# Patient Record
Sex: Female | Born: 1950
Health system: Southern US, Community
[De-identification: ages and names within clinical notes are randomized; demographics above are authoritative.]

## PROBLEM LIST (undated history)

## (undated) DIAGNOSIS — I10 Essential (primary) hypertension: Secondary | ICD-10-CM

## (undated) DIAGNOSIS — R112 Nausea with vomiting, unspecified: Secondary | ICD-10-CM

## (undated) DIAGNOSIS — F0390 Unspecified dementia without behavioral disturbance: Secondary | ICD-10-CM

## (undated) DIAGNOSIS — Z96652 Presence of left artificial knee joint: Secondary | ICD-10-CM

## (undated) DIAGNOSIS — E785 Hyperlipidemia, unspecified: Secondary | ICD-10-CM

## (undated) DIAGNOSIS — M1990 Unspecified osteoarthritis, unspecified site: Secondary | ICD-10-CM

## (undated) DIAGNOSIS — Z9889 Other specified postprocedural states: Secondary | ICD-10-CM

## (undated) HISTORY — PX: TONSILLECTOMY: SUR1361

## (undated) HISTORY — PX: COLONOSCOPY WITH PROPOFOL: SHX5780

## (undated) HISTORY — DX: Presence of left artificial knee joint: Z96.652

## (undated) HISTORY — PX: THYROID SURGERY: SHX805

## (undated) HISTORY — PX: AORTIC VALVE REPLACEMENT: SHX41

## (undated) HISTORY — PX: EXPLORATION POST OPERATIVE OPEN HEART: SHX5061

## (undated) HISTORY — PX: FINGER SURGERY: SHX640

---

## 2017-08-16 DIAGNOSIS — Z0000 Encounter for general adult medical examination without abnormal findings: Secondary | ICD-10-CM | POA: Diagnosis not present

## 2017-08-28 DIAGNOSIS — M25562 Pain in left knee: Secondary | ICD-10-CM | POA: Insufficient documentation

## 2017-08-28 DIAGNOSIS — G8929 Other chronic pain: Secondary | ICD-10-CM | POA: Diagnosis not present

## 2017-08-28 DIAGNOSIS — M1712 Unilateral primary osteoarthritis, left knee: Secondary | ICD-10-CM | POA: Diagnosis not present

## 2017-08-28 HISTORY — DX: Other chronic pain: G89.29

## 2017-08-28 HISTORY — DX: Pain in left knee: M25.562

## 2017-09-11 DIAGNOSIS — E785 Hyperlipidemia, unspecified: Secondary | ICD-10-CM | POA: Diagnosis not present

## 2017-09-11 DIAGNOSIS — Z79899 Other long term (current) drug therapy: Secondary | ICD-10-CM | POA: Diagnosis not present

## 2017-09-11 DIAGNOSIS — I10 Essential (primary) hypertension: Secondary | ICD-10-CM | POA: Diagnosis not present

## 2017-09-11 DIAGNOSIS — M8580 Other specified disorders of bone density and structure, unspecified site: Secondary | ICD-10-CM | POA: Diagnosis not present

## 2017-09-11 DIAGNOSIS — R011 Cardiac murmur, unspecified: Secondary | ICD-10-CM | POA: Diagnosis not present

## 2017-09-14 DIAGNOSIS — I083 Combined rheumatic disorders of mitral, aortic and tricuspid valves: Secondary | ICD-10-CM | POA: Diagnosis not present

## 2017-09-14 DIAGNOSIS — Z1231 Encounter for screening mammogram for malignant neoplasm of breast: Secondary | ICD-10-CM | POA: Diagnosis not present

## 2017-09-14 DIAGNOSIS — R011 Cardiac murmur, unspecified: Secondary | ICD-10-CM | POA: Diagnosis not present

## 2017-10-03 DIAGNOSIS — Z1211 Encounter for screening for malignant neoplasm of colon: Secondary | ICD-10-CM | POA: Diagnosis not present

## 2017-10-03 DIAGNOSIS — K635 Polyp of colon: Secondary | ICD-10-CM | POA: Diagnosis not present

## 2017-10-03 DIAGNOSIS — D120 Benign neoplasm of cecum: Secondary | ICD-10-CM | POA: Diagnosis not present

## 2017-10-05 DIAGNOSIS — I352 Nonrheumatic aortic (valve) stenosis with insufficiency: Secondary | ICD-10-CM | POA: Diagnosis not present

## 2017-10-05 DIAGNOSIS — I493 Ventricular premature depolarization: Secondary | ICD-10-CM | POA: Diagnosis not present

## 2017-10-05 DIAGNOSIS — I10 Essential (primary) hypertension: Secondary | ICD-10-CM | POA: Diagnosis not present

## 2017-10-05 HISTORY — DX: Ventricular premature depolarization: I49.3

## 2017-10-05 HISTORY — DX: Essential (primary) hypertension: I10

## 2017-10-07 DIAGNOSIS — I493 Ventricular premature depolarization: Secondary | ICD-10-CM | POA: Diagnosis not present

## 2017-10-11 DIAGNOSIS — I350 Nonrheumatic aortic (valve) stenosis: Secondary | ICD-10-CM | POA: Diagnosis not present

## 2017-10-18 DIAGNOSIS — I6523 Occlusion and stenosis of bilateral carotid arteries: Secondary | ICD-10-CM | POA: Diagnosis not present

## 2017-10-18 DIAGNOSIS — I350 Nonrheumatic aortic (valve) stenosis: Secondary | ICD-10-CM | POA: Diagnosis not present

## 2017-10-18 DIAGNOSIS — I2510 Atherosclerotic heart disease of native coronary artery without angina pectoris: Secondary | ICD-10-CM | POA: Diagnosis not present

## 2017-10-18 DIAGNOSIS — I352 Nonrheumatic aortic (valve) stenosis with insufficiency: Secondary | ICD-10-CM | POA: Diagnosis not present

## 2017-10-18 DIAGNOSIS — J449 Chronic obstructive pulmonary disease, unspecified: Secondary | ICD-10-CM | POA: Diagnosis not present

## 2017-10-18 DIAGNOSIS — R918 Other nonspecific abnormal finding of lung field: Secondary | ICD-10-CM | POA: Diagnosis not present

## 2017-10-18 DIAGNOSIS — Z452 Encounter for adjustment and management of vascular access device: Secondary | ICD-10-CM | POA: Diagnosis not present

## 2017-10-18 DIAGNOSIS — M170 Bilateral primary osteoarthritis of knee: Secondary | ICD-10-CM | POA: Diagnosis not present

## 2017-11-12 DIAGNOSIS — I352 Nonrheumatic aortic (valve) stenosis with insufficiency: Secondary | ICD-10-CM | POA: Diagnosis not present

## 2017-11-12 DIAGNOSIS — I25811 Atherosclerosis of native coronary artery of transplanted heart without angina pectoris: Secondary | ICD-10-CM | POA: Diagnosis not present

## 2017-11-20 DIAGNOSIS — E663 Overweight: Secondary | ICD-10-CM | POA: Diagnosis not present

## 2017-11-20 DIAGNOSIS — R0609 Other forms of dyspnea: Secondary | ICD-10-CM | POA: Insufficient documentation

## 2017-11-20 DIAGNOSIS — I2510 Atherosclerotic heart disease of native coronary artery without angina pectoris: Secondary | ICD-10-CM | POA: Diagnosis not present

## 2017-11-20 DIAGNOSIS — I352 Nonrheumatic aortic (valve) stenosis with insufficiency: Secondary | ICD-10-CM | POA: Diagnosis not present

## 2017-11-20 DIAGNOSIS — I350 Nonrheumatic aortic (valve) stenosis: Secondary | ICD-10-CM | POA: Diagnosis not present

## 2017-11-20 HISTORY — DX: Other forms of dyspnea: R06.09

## 2017-11-29 DIAGNOSIS — I350 Nonrheumatic aortic (valve) stenosis: Secondary | ICD-10-CM | POA: Diagnosis not present

## 2017-11-29 DIAGNOSIS — I509 Heart failure, unspecified: Secondary | ICD-10-CM | POA: Diagnosis not present

## 2017-11-29 DIAGNOSIS — I2510 Atherosclerotic heart disease of native coronary artery without angina pectoris: Secondary | ICD-10-CM

## 2017-11-29 DIAGNOSIS — E079 Disorder of thyroid, unspecified: Secondary | ICD-10-CM | POA: Diagnosis not present

## 2017-11-29 HISTORY — DX: Atherosclerotic heart disease of native coronary artery without angina pectoris: I25.10

## 2017-11-29 HISTORY — DX: Nonrheumatic aortic (valve) stenosis: I35.0

## 2017-11-30 DIAGNOSIS — R490 Dysphonia: Secondary | ICD-10-CM | POA: Diagnosis not present

## 2017-11-30 DIAGNOSIS — E049 Nontoxic goiter, unspecified: Secondary | ICD-10-CM | POA: Diagnosis not present

## 2017-11-30 DIAGNOSIS — R1314 Dysphagia, pharyngoesophageal phase: Secondary | ICD-10-CM | POA: Diagnosis not present

## 2017-11-30 HISTORY — DX: Dysphagia, pharyngoesophageal phase: R13.14

## 2017-11-30 HISTORY — DX: Nontoxic goiter, unspecified: E04.9

## 2017-12-07 DIAGNOSIS — Z01810 Encounter for preprocedural cardiovascular examination: Secondary | ICD-10-CM | POA: Diagnosis not present

## 2017-12-07 DIAGNOSIS — I2510 Atherosclerotic heart disease of native coronary artery without angina pectoris: Secondary | ICD-10-CM | POA: Diagnosis not present

## 2017-12-07 DIAGNOSIS — J449 Chronic obstructive pulmonary disease, unspecified: Secondary | ICD-10-CM

## 2017-12-07 DIAGNOSIS — I350 Nonrheumatic aortic (valve) stenosis: Secondary | ICD-10-CM | POA: Diagnosis not present

## 2017-12-07 HISTORY — DX: Chronic obstructive pulmonary disease, unspecified: J44.9

## 2017-12-14 DIAGNOSIS — Z954 Presence of other heart-valve replacement: Secondary | ICD-10-CM | POA: Diagnosis not present

## 2017-12-14 DIAGNOSIS — E048 Other specified nontoxic goiter: Secondary | ICD-10-CM | POA: Diagnosis not present

## 2017-12-14 DIAGNOSIS — Z952 Presence of prosthetic heart valve: Secondary | ICD-10-CM | POA: Diagnosis not present

## 2017-12-14 DIAGNOSIS — J449 Chronic obstructive pulmonary disease, unspecified: Secondary | ICD-10-CM | POA: Diagnosis present

## 2017-12-14 DIAGNOSIS — I110 Hypertensive heart disease with heart failure: Secondary | ICD-10-CM | POA: Diagnosis present

## 2017-12-14 DIAGNOSIS — D72829 Elevated white blood cell count, unspecified: Secondary | ICD-10-CM | POA: Diagnosis not present

## 2017-12-14 DIAGNOSIS — I959 Hypotension, unspecified: Secondary | ICD-10-CM | POA: Diagnosis not present

## 2017-12-14 DIAGNOSIS — I25810 Atherosclerosis of coronary artery bypass graft(s) without angina pectoris: Secondary | ICD-10-CM | POA: Diagnosis not present

## 2017-12-14 DIAGNOSIS — D7289 Other specified disorders of white blood cells: Secondary | ICD-10-CM | POA: Diagnosis not present

## 2017-12-14 DIAGNOSIS — C73 Malignant neoplasm of thyroid gland: Secondary | ICD-10-CM | POA: Diagnosis not present

## 2017-12-14 DIAGNOSIS — Z951 Presence of aortocoronary bypass graft: Secondary | ICD-10-CM | POA: Diagnosis not present

## 2017-12-14 DIAGNOSIS — I639 Cerebral infarction, unspecified: Secondary | ICD-10-CM | POA: Diagnosis not present

## 2017-12-14 DIAGNOSIS — Z6836 Body mass index (BMI) 36.0-36.9, adult: Secondary | ICD-10-CM | POA: Diagnosis not present

## 2017-12-14 DIAGNOSIS — J9601 Acute respiratory failure with hypoxia: Secondary | ICD-10-CM | POA: Diagnosis not present

## 2017-12-14 DIAGNOSIS — R9431 Abnormal electrocardiogram [ECG] [EKG]: Secondary | ICD-10-CM | POA: Diagnosis not present

## 2017-12-14 DIAGNOSIS — Z9889 Other specified postprocedural states: Secondary | ICD-10-CM | POA: Diagnosis not present

## 2017-12-14 DIAGNOSIS — M25562 Pain in left knee: Secondary | ICD-10-CM | POA: Diagnosis not present

## 2017-12-14 DIAGNOSIS — I4891 Unspecified atrial fibrillation: Secondary | ICD-10-CM | POA: Diagnosis not present

## 2017-12-14 DIAGNOSIS — J942 Hemothorax: Secondary | ICD-10-CM | POA: Diagnosis not present

## 2017-12-14 DIAGNOSIS — Z9911 Dependence on respirator [ventilator] status: Secondary | ICD-10-CM | POA: Diagnosis not present

## 2017-12-14 DIAGNOSIS — E669 Obesity, unspecified: Secondary | ICD-10-CM | POA: Diagnosis present

## 2017-12-14 DIAGNOSIS — E079 Disorder of thyroid, unspecified: Secondary | ICD-10-CM | POA: Diagnosis not present

## 2017-12-14 DIAGNOSIS — T8172XD Complication of vein following a procedure, not elsewhere classified, subsequent encounter: Secondary | ICD-10-CM | POA: Diagnosis not present

## 2017-12-14 DIAGNOSIS — Z743 Need for continuous supervision: Secondary | ICD-10-CM | POA: Diagnosis not present

## 2017-12-14 DIAGNOSIS — R918 Other nonspecific abnormal finding of lung field: Secondary | ICD-10-CM | POA: Diagnosis not present

## 2017-12-14 DIAGNOSIS — J95821 Acute postprocedural respiratory failure: Secondary | ICD-10-CM | POA: Diagnosis not present

## 2017-12-14 DIAGNOSIS — E049 Nontoxic goiter, unspecified: Secondary | ICD-10-CM | POA: Diagnosis not present

## 2017-12-14 DIAGNOSIS — J9811 Atelectasis: Secondary | ICD-10-CM | POA: Diagnosis not present

## 2017-12-14 DIAGNOSIS — I9581 Postprocedural hypotension: Secondary | ICD-10-CM | POA: Diagnosis not present

## 2017-12-14 DIAGNOSIS — D62 Acute posthemorrhagic anemia: Secondary | ICD-10-CM | POA: Diagnosis not present

## 2017-12-14 DIAGNOSIS — I2510 Atherosclerotic heart disease of native coronary artery without angina pectoris: Secondary | ICD-10-CM | POA: Diagnosis present

## 2017-12-14 DIAGNOSIS — I10 Essential (primary) hypertension: Secondary | ICD-10-CM | POA: Diagnosis not present

## 2017-12-14 DIAGNOSIS — I509 Heart failure, unspecified: Secondary | ICD-10-CM | POA: Diagnosis present

## 2017-12-14 DIAGNOSIS — I352 Nonrheumatic aortic (valve) stenosis with insufficiency: Secondary | ICD-10-CM | POA: Diagnosis present

## 2017-12-14 DIAGNOSIS — R279 Unspecified lack of coordination: Secondary | ICD-10-CM | POA: Diagnosis not present

## 2017-12-14 DIAGNOSIS — R0602 Shortness of breath: Secondary | ICD-10-CM | POA: Diagnosis not present

## 2017-12-14 DIAGNOSIS — I480 Paroxysmal atrial fibrillation: Secondary | ICD-10-CM | POA: Diagnosis not present

## 2017-12-14 DIAGNOSIS — I350 Nonrheumatic aortic (valve) stenosis: Secondary | ICD-10-CM | POA: Diagnosis not present

## 2017-12-14 DIAGNOSIS — E042 Nontoxic multinodular goiter: Secondary | ICD-10-CM | POA: Diagnosis present

## 2017-12-14 DIAGNOSIS — I871 Compression of vein: Secondary | ICD-10-CM | POA: Diagnosis not present

## 2017-12-14 DIAGNOSIS — J90 Pleural effusion, not elsewhere classified: Secondary | ICD-10-CM | POA: Diagnosis not present

## 2017-12-23 DIAGNOSIS — R9431 Abnormal electrocardiogram [ECG] [EKG]: Secondary | ICD-10-CM | POA: Diagnosis not present

## 2017-12-25 DIAGNOSIS — I4891 Unspecified atrial fibrillation: Secondary | ICD-10-CM | POA: Diagnosis not present

## 2017-12-25 DIAGNOSIS — I10 Essential (primary) hypertension: Secondary | ICD-10-CM | POA: Diagnosis not present

## 2017-12-25 DIAGNOSIS — I350 Nonrheumatic aortic (valve) stenosis: Secondary | ICD-10-CM | POA: Diagnosis not present

## 2017-12-25 DIAGNOSIS — T8172XD Complication of vein following a procedure, not elsewhere classified, subsequent encounter: Secondary | ICD-10-CM | POA: Diagnosis not present

## 2017-12-25 DIAGNOSIS — C73 Malignant neoplasm of thyroid gland: Secondary | ICD-10-CM | POA: Diagnosis not present

## 2017-12-25 DIAGNOSIS — J449 Chronic obstructive pulmonary disease, unspecified: Secondary | ICD-10-CM | POA: Diagnosis not present

## 2017-12-25 DIAGNOSIS — R9431 Abnormal electrocardiogram [ECG] [EKG]: Secondary | ICD-10-CM | POA: Diagnosis not present

## 2017-12-25 DIAGNOSIS — Z951 Presence of aortocoronary bypass graft: Secondary | ICD-10-CM | POA: Diagnosis not present

## 2017-12-25 DIAGNOSIS — Z743 Need for continuous supervision: Secondary | ICD-10-CM | POA: Diagnosis not present

## 2017-12-25 DIAGNOSIS — R279 Unspecified lack of coordination: Secondary | ICD-10-CM | POA: Diagnosis not present

## 2017-12-25 DIAGNOSIS — M25562 Pain in left knee: Secondary | ICD-10-CM | POA: Diagnosis not present

## 2017-12-25 DIAGNOSIS — J90 Pleural effusion, not elsewhere classified: Secondary | ICD-10-CM | POA: Diagnosis not present

## 2017-12-25 DIAGNOSIS — I2510 Atherosclerotic heart disease of native coronary artery without angina pectoris: Secondary | ICD-10-CM | POA: Diagnosis not present

## 2017-12-25 DIAGNOSIS — I959 Hypotension, unspecified: Secondary | ICD-10-CM | POA: Diagnosis not present

## 2017-12-25 DIAGNOSIS — R262 Difficulty in walking, not elsewhere classified: Secondary | ICD-10-CM | POA: Diagnosis not present

## 2017-12-25 DIAGNOSIS — Z954 Presence of other heart-valve replacement: Secondary | ICD-10-CM | POA: Diagnosis not present

## 2017-12-25 DIAGNOSIS — E079 Disorder of thyroid, unspecified: Secondary | ICD-10-CM | POA: Diagnosis not present

## 2017-12-25 DIAGNOSIS — J9811 Atelectasis: Secondary | ICD-10-CM | POA: Diagnosis not present

## 2017-12-25 DIAGNOSIS — D649 Anemia, unspecified: Secondary | ICD-10-CM | POA: Diagnosis not present

## 2017-12-25 DIAGNOSIS — R0602 Shortness of breath: Secondary | ICD-10-CM | POA: Diagnosis not present

## 2017-12-27 DIAGNOSIS — D649 Anemia, unspecified: Secondary | ICD-10-CM | POA: Diagnosis not present

## 2017-12-27 DIAGNOSIS — R262 Difficulty in walking, not elsewhere classified: Secondary | ICD-10-CM | POA: Diagnosis not present

## 2017-12-27 DIAGNOSIS — I4891 Unspecified atrial fibrillation: Secondary | ICD-10-CM | POA: Diagnosis not present

## 2017-12-27 DIAGNOSIS — I2510 Atherosclerotic heart disease of native coronary artery without angina pectoris: Secondary | ICD-10-CM | POA: Diagnosis not present

## 2018-01-10 DIAGNOSIS — Z955 Presence of coronary angioplasty implant and graft: Secondary | ICD-10-CM | POA: Diagnosis not present

## 2018-01-10 DIAGNOSIS — Z7901 Long term (current) use of anticoagulants: Secondary | ICD-10-CM | POA: Diagnosis not present

## 2018-01-10 DIAGNOSIS — I4891 Unspecified atrial fibrillation: Secondary | ICD-10-CM | POA: Diagnosis not present

## 2018-01-10 DIAGNOSIS — Z48812 Encounter for surgical aftercare following surgery on the circulatory system: Secondary | ICD-10-CM | POA: Diagnosis not present

## 2018-01-10 DIAGNOSIS — Z952 Presence of prosthetic heart valve: Secondary | ICD-10-CM | POA: Diagnosis not present

## 2018-01-21 DIAGNOSIS — Z79899 Other long term (current) drug therapy: Secondary | ICD-10-CM | POA: Diagnosis not present

## 2018-01-21 DIAGNOSIS — I9789 Other postprocedural complications and disorders of the circulatory system, not elsewhere classified: Secondary | ICD-10-CM | POA: Diagnosis not present

## 2018-01-21 DIAGNOSIS — I2510 Atherosclerotic heart disease of native coronary artery without angina pectoris: Secondary | ICD-10-CM | POA: Diagnosis not present

## 2018-01-21 DIAGNOSIS — I4891 Unspecified atrial fibrillation: Secondary | ICD-10-CM | POA: Diagnosis not present

## 2018-01-21 DIAGNOSIS — Z9889 Other specified postprocedural states: Secondary | ICD-10-CM | POA: Diagnosis not present

## 2018-01-21 DIAGNOSIS — E049 Nontoxic goiter, unspecified: Secondary | ICD-10-CM | POA: Diagnosis not present

## 2018-01-22 DIAGNOSIS — Z951 Presence of aortocoronary bypass graft: Secondary | ICD-10-CM | POA: Diagnosis not present

## 2018-01-23 DIAGNOSIS — Z9089 Acquired absence of other organs: Secondary | ICD-10-CM | POA: Diagnosis not present

## 2018-01-23 DIAGNOSIS — E049 Nontoxic goiter, unspecified: Secondary | ICD-10-CM | POA: Diagnosis not present

## 2018-01-23 DIAGNOSIS — Z951 Presence of aortocoronary bypass graft: Secondary | ICD-10-CM | POA: Diagnosis not present

## 2018-01-23 DIAGNOSIS — Z4889 Encounter for other specified surgical aftercare: Secondary | ICD-10-CM | POA: Diagnosis not present

## 2018-01-23 DIAGNOSIS — Z952 Presence of prosthetic heart valve: Secondary | ICD-10-CM | POA: Diagnosis not present

## 2018-01-25 DIAGNOSIS — Z951 Presence of aortocoronary bypass graft: Secondary | ICD-10-CM | POA: Diagnosis not present

## 2018-01-28 DIAGNOSIS — Z9089 Acquired absence of other organs: Secondary | ICD-10-CM | POA: Diagnosis not present

## 2018-01-28 DIAGNOSIS — E049 Nontoxic goiter, unspecified: Secondary | ICD-10-CM | POA: Diagnosis not present

## 2018-01-28 DIAGNOSIS — Z951 Presence of aortocoronary bypass graft: Secondary | ICD-10-CM | POA: Diagnosis not present

## 2018-01-30 DIAGNOSIS — Z951 Presence of aortocoronary bypass graft: Secondary | ICD-10-CM | POA: Diagnosis not present

## 2018-02-01 DIAGNOSIS — Z951 Presence of aortocoronary bypass graft: Secondary | ICD-10-CM | POA: Diagnosis not present

## 2018-02-04 DIAGNOSIS — Z951 Presence of aortocoronary bypass graft: Secondary | ICD-10-CM | POA: Diagnosis not present

## 2018-02-06 DIAGNOSIS — Z951 Presence of aortocoronary bypass graft: Secondary | ICD-10-CM | POA: Diagnosis not present

## 2018-02-08 DIAGNOSIS — Z951 Presence of aortocoronary bypass graft: Secondary | ICD-10-CM | POA: Diagnosis not present

## 2018-02-11 DIAGNOSIS — Z951 Presence of aortocoronary bypass graft: Secondary | ICD-10-CM | POA: Diagnosis not present

## 2018-02-12 DIAGNOSIS — R7989 Other specified abnormal findings of blood chemistry: Secondary | ICD-10-CM | POA: Diagnosis not present

## 2018-02-12 DIAGNOSIS — Z9189 Other specified personal risk factors, not elsewhere classified: Secondary | ICD-10-CM | POA: Diagnosis not present

## 2018-02-12 DIAGNOSIS — E789 Disorder of lipoprotein metabolism, unspecified: Secondary | ICD-10-CM | POA: Diagnosis not present

## 2018-02-12 DIAGNOSIS — E785 Hyperlipidemia, unspecified: Secondary | ICD-10-CM | POA: Insufficient documentation

## 2018-02-12 DIAGNOSIS — I350 Nonrheumatic aortic (valve) stenosis: Secondary | ICD-10-CM | POA: Diagnosis not present

## 2018-02-12 DIAGNOSIS — I480 Paroxysmal atrial fibrillation: Secondary | ICD-10-CM

## 2018-02-12 DIAGNOSIS — Z79899 Other long term (current) drug therapy: Secondary | ICD-10-CM | POA: Diagnosis not present

## 2018-02-12 DIAGNOSIS — Z952 Presence of prosthetic heart valve: Secondary | ICD-10-CM | POA: Diagnosis not present

## 2018-02-12 DIAGNOSIS — I2510 Atherosclerotic heart disease of native coronary artery without angina pectoris: Secondary | ICD-10-CM | POA: Diagnosis not present

## 2018-02-12 DIAGNOSIS — Z8679 Personal history of other diseases of the circulatory system: Secondary | ICD-10-CM | POA: Diagnosis not present

## 2018-02-12 DIAGNOSIS — Z951 Presence of aortocoronary bypass graft: Secondary | ICD-10-CM | POA: Diagnosis not present

## 2018-02-12 HISTORY — DX: Paroxysmal atrial fibrillation: I48.0

## 2018-02-12 HISTORY — DX: Hyperlipidemia, unspecified: E78.5

## 2018-02-13 DIAGNOSIS — Z952 Presence of prosthetic heart valve: Secondary | ICD-10-CM | POA: Diagnosis not present

## 2018-02-13 DIAGNOSIS — Z951 Presence of aortocoronary bypass graft: Secondary | ICD-10-CM | POA: Diagnosis not present

## 2018-02-13 DIAGNOSIS — I350 Nonrheumatic aortic (valve) stenosis: Secondary | ICD-10-CM | POA: Diagnosis not present

## 2018-02-13 DIAGNOSIS — I2510 Atherosclerotic heart disease of native coronary artery without angina pectoris: Secondary | ICD-10-CM | POA: Diagnosis not present

## 2018-02-15 DIAGNOSIS — Z951 Presence of aortocoronary bypass graft: Secondary | ICD-10-CM | POA: Diagnosis not present

## 2018-02-15 DIAGNOSIS — Z952 Presence of prosthetic heart valve: Secondary | ICD-10-CM | POA: Diagnosis not present

## 2018-02-18 DIAGNOSIS — Z951 Presence of aortocoronary bypass graft: Secondary | ICD-10-CM | POA: Diagnosis not present

## 2018-02-18 DIAGNOSIS — Z952 Presence of prosthetic heart valve: Secondary | ICD-10-CM | POA: Diagnosis not present

## 2018-02-20 DIAGNOSIS — Z952 Presence of prosthetic heart valve: Secondary | ICD-10-CM | POA: Diagnosis not present

## 2018-02-20 DIAGNOSIS — Z951 Presence of aortocoronary bypass graft: Secondary | ICD-10-CM | POA: Diagnosis not present

## 2018-02-22 DIAGNOSIS — Z952 Presence of prosthetic heart valve: Secondary | ICD-10-CM | POA: Diagnosis not present

## 2018-02-22 DIAGNOSIS — Z951 Presence of aortocoronary bypass graft: Secondary | ICD-10-CM | POA: Diagnosis not present

## 2018-02-25 DIAGNOSIS — Z951 Presence of aortocoronary bypass graft: Secondary | ICD-10-CM | POA: Diagnosis not present

## 2018-02-25 DIAGNOSIS — Z952 Presence of prosthetic heart valve: Secondary | ICD-10-CM | POA: Diagnosis not present

## 2018-02-27 DIAGNOSIS — Z952 Presence of prosthetic heart valve: Secondary | ICD-10-CM | POA: Diagnosis not present

## 2018-02-27 DIAGNOSIS — Z951 Presence of aortocoronary bypass graft: Secondary | ICD-10-CM | POA: Diagnosis not present

## 2018-03-01 DIAGNOSIS — Z951 Presence of aortocoronary bypass graft: Secondary | ICD-10-CM | POA: Diagnosis not present

## 2018-03-01 DIAGNOSIS — Z952 Presence of prosthetic heart valve: Secondary | ICD-10-CM | POA: Diagnosis not present

## 2018-03-04 DIAGNOSIS — Z951 Presence of aortocoronary bypass graft: Secondary | ICD-10-CM | POA: Diagnosis not present

## 2018-03-04 DIAGNOSIS — Z952 Presence of prosthetic heart valve: Secondary | ICD-10-CM | POA: Diagnosis not present

## 2018-03-06 DIAGNOSIS — Z952 Presence of prosthetic heart valve: Secondary | ICD-10-CM | POA: Diagnosis not present

## 2018-03-06 DIAGNOSIS — Z951 Presence of aortocoronary bypass graft: Secondary | ICD-10-CM | POA: Diagnosis not present

## 2018-03-08 DIAGNOSIS — Z951 Presence of aortocoronary bypass graft: Secondary | ICD-10-CM | POA: Diagnosis not present

## 2018-03-08 DIAGNOSIS — Z952 Presence of prosthetic heart valve: Secondary | ICD-10-CM | POA: Diagnosis not present

## 2018-03-11 DIAGNOSIS — Z951 Presence of aortocoronary bypass graft: Secondary | ICD-10-CM | POA: Diagnosis not present

## 2018-03-11 DIAGNOSIS — Z952 Presence of prosthetic heart valve: Secondary | ICD-10-CM | POA: Diagnosis not present

## 2018-03-13 DIAGNOSIS — Z951 Presence of aortocoronary bypass graft: Secondary | ICD-10-CM | POA: Diagnosis not present

## 2018-03-13 DIAGNOSIS — Z952 Presence of prosthetic heart valve: Secondary | ICD-10-CM | POA: Diagnosis not present

## 2018-03-15 DIAGNOSIS — Z951 Presence of aortocoronary bypass graft: Secondary | ICD-10-CM | POA: Diagnosis not present

## 2018-03-18 DIAGNOSIS — Z951 Presence of aortocoronary bypass graft: Secondary | ICD-10-CM | POA: Diagnosis not present

## 2018-03-19 DIAGNOSIS — H52223 Regular astigmatism, bilateral: Secondary | ICD-10-CM | POA: Diagnosis not present

## 2018-03-19 DIAGNOSIS — H2513 Age-related nuclear cataract, bilateral: Secondary | ICD-10-CM | POA: Diagnosis not present

## 2018-03-19 DIAGNOSIS — H35013 Changes in retinal vascular appearance, bilateral: Secondary | ICD-10-CM | POA: Diagnosis not present

## 2018-03-20 DIAGNOSIS — Z951 Presence of aortocoronary bypass graft: Secondary | ICD-10-CM | POA: Diagnosis not present

## 2018-03-22 DIAGNOSIS — Z951 Presence of aortocoronary bypass graft: Secondary | ICD-10-CM | POA: Diagnosis not present

## 2018-03-25 DIAGNOSIS — E049 Nontoxic goiter, unspecified: Secondary | ICD-10-CM | POA: Diagnosis not present

## 2018-03-25 DIAGNOSIS — Z951 Presence of aortocoronary bypass graft: Secondary | ICD-10-CM | POA: Diagnosis not present

## 2018-03-29 DIAGNOSIS — Z951 Presence of aortocoronary bypass graft: Secondary | ICD-10-CM | POA: Diagnosis not present

## 2018-04-01 DIAGNOSIS — Z951 Presence of aortocoronary bypass graft: Secondary | ICD-10-CM | POA: Diagnosis not present

## 2018-04-03 DIAGNOSIS — Z951 Presence of aortocoronary bypass graft: Secondary | ICD-10-CM | POA: Diagnosis not present

## 2018-04-05 DIAGNOSIS — Z951 Presence of aortocoronary bypass graft: Secondary | ICD-10-CM | POA: Diagnosis not present

## 2018-04-08 DIAGNOSIS — Z951 Presence of aortocoronary bypass graft: Secondary | ICD-10-CM | POA: Diagnosis not present

## 2018-04-09 DIAGNOSIS — H18413 Arcus senilis, bilateral: Secondary | ICD-10-CM | POA: Diagnosis not present

## 2018-04-09 DIAGNOSIS — H2513 Age-related nuclear cataract, bilateral: Secondary | ICD-10-CM | POA: Diagnosis not present

## 2018-04-09 DIAGNOSIS — H25013 Cortical age-related cataract, bilateral: Secondary | ICD-10-CM | POA: Diagnosis not present

## 2018-04-09 DIAGNOSIS — H2512 Age-related nuclear cataract, left eye: Secondary | ICD-10-CM | POA: Diagnosis not present

## 2018-04-09 DIAGNOSIS — H02831 Dermatochalasis of right upper eyelid: Secondary | ICD-10-CM | POA: Diagnosis not present

## 2018-04-09 DIAGNOSIS — Z951 Presence of aortocoronary bypass graft: Secondary | ICD-10-CM | POA: Diagnosis not present

## 2018-04-10 DIAGNOSIS — Z951 Presence of aortocoronary bypass graft: Secondary | ICD-10-CM | POA: Diagnosis not present

## 2018-04-15 DIAGNOSIS — Z951 Presence of aortocoronary bypass graft: Secondary | ICD-10-CM | POA: Diagnosis not present

## 2018-05-20 DIAGNOSIS — H2512 Age-related nuclear cataract, left eye: Secondary | ICD-10-CM | POA: Diagnosis not present

## 2018-05-21 DIAGNOSIS — H2511 Age-related nuclear cataract, right eye: Secondary | ICD-10-CM | POA: Diagnosis not present

## 2018-06-10 DIAGNOSIS — H2511 Age-related nuclear cataract, right eye: Secondary | ICD-10-CM | POA: Diagnosis not present

## 2018-08-19 DIAGNOSIS — Z951 Presence of aortocoronary bypass graft: Secondary | ICD-10-CM | POA: Diagnosis not present

## 2018-08-19 DIAGNOSIS — E789 Disorder of lipoprotein metabolism, unspecified: Secondary | ICD-10-CM | POA: Diagnosis not present

## 2018-08-19 DIAGNOSIS — I10 Essential (primary) hypertension: Secondary | ICD-10-CM | POA: Diagnosis not present

## 2018-08-19 DIAGNOSIS — Z954 Presence of other heart-valve replacement: Secondary | ICD-10-CM | POA: Diagnosis not present

## 2018-08-19 DIAGNOSIS — I2510 Atherosclerotic heart disease of native coronary artery without angina pectoris: Secondary | ICD-10-CM | POA: Diagnosis not present

## 2018-08-19 DIAGNOSIS — I493 Ventricular premature depolarization: Secondary | ICD-10-CM | POA: Diagnosis not present

## 2018-08-19 DIAGNOSIS — Z952 Presence of prosthetic heart valve: Secondary | ICD-10-CM | POA: Diagnosis not present

## 2018-08-19 HISTORY — DX: Presence of aortocoronary bypass graft: Z95.1

## 2018-11-05 DIAGNOSIS — Z20828 Contact with and (suspected) exposure to other viral communicable diseases: Secondary | ICD-10-CM | POA: Diagnosis not present

## 2018-12-04 DIAGNOSIS — R739 Hyperglycemia, unspecified: Secondary | ICD-10-CM | POA: Diagnosis not present

## 2018-12-04 DIAGNOSIS — E049 Nontoxic goiter, unspecified: Secondary | ICD-10-CM | POA: Diagnosis not present

## 2018-12-04 DIAGNOSIS — Z951 Presence of aortocoronary bypass graft: Secondary | ICD-10-CM | POA: Diagnosis not present

## 2018-12-04 DIAGNOSIS — E785 Hyperlipidemia, unspecified: Secondary | ICD-10-CM | POA: Diagnosis not present

## 2018-12-04 DIAGNOSIS — I2510 Atherosclerotic heart disease of native coronary artery without angina pectoris: Secondary | ICD-10-CM | POA: Diagnosis not present

## 2018-12-04 DIAGNOSIS — I10 Essential (primary) hypertension: Secondary | ICD-10-CM | POA: Diagnosis not present

## 2018-12-04 DIAGNOSIS — Z0000 Encounter for general adult medical examination without abnormal findings: Secondary | ICD-10-CM | POA: Diagnosis not present

## 2018-12-04 DIAGNOSIS — Z79899 Other long term (current) drug therapy: Secondary | ICD-10-CM | POA: Diagnosis not present

## 2018-12-13 ENCOUNTER — Other Ambulatory Visit: Payer: Self-pay

## 2018-12-19 DIAGNOSIS — Z952 Presence of prosthetic heart valve: Secondary | ICD-10-CM | POA: Diagnosis not present

## 2019-02-19 DIAGNOSIS — I493 Ventricular premature depolarization: Secondary | ICD-10-CM | POA: Diagnosis not present

## 2019-02-19 DIAGNOSIS — Z951 Presence of aortocoronary bypass graft: Secondary | ICD-10-CM | POA: Diagnosis not present

## 2019-02-19 DIAGNOSIS — E789 Disorder of lipoprotein metabolism, unspecified: Secondary | ICD-10-CM | POA: Diagnosis not present

## 2019-02-19 DIAGNOSIS — Z952 Presence of prosthetic heart valve: Secondary | ICD-10-CM | POA: Diagnosis not present

## 2019-02-19 DIAGNOSIS — Z954 Presence of other heart-valve replacement: Secondary | ICD-10-CM | POA: Diagnosis not present

## 2019-02-19 DIAGNOSIS — I10 Essential (primary) hypertension: Secondary | ICD-10-CM | POA: Diagnosis not present

## 2019-02-19 DIAGNOSIS — I480 Paroxysmal atrial fibrillation: Secondary | ICD-10-CM | POA: Diagnosis not present

## 2019-02-19 DIAGNOSIS — I2510 Atherosclerotic heart disease of native coronary artery without angina pectoris: Secondary | ICD-10-CM | POA: Diagnosis not present

## 2019-04-14 DIAGNOSIS — Z1231 Encounter for screening mammogram for malignant neoplasm of breast: Secondary | ICD-10-CM | POA: Diagnosis not present

## 2019-08-29 DIAGNOSIS — I480 Paroxysmal atrial fibrillation: Secondary | ICD-10-CM | POA: Diagnosis not present

## 2019-08-29 DIAGNOSIS — I10 Essential (primary) hypertension: Secondary | ICD-10-CM | POA: Diagnosis not present

## 2019-08-29 DIAGNOSIS — E789 Disorder of lipoprotein metabolism, unspecified: Secondary | ICD-10-CM | POA: Diagnosis not present

## 2019-08-29 DIAGNOSIS — I2510 Atherosclerotic heart disease of native coronary artery without angina pectoris: Secondary | ICD-10-CM | POA: Diagnosis not present

## 2019-08-29 DIAGNOSIS — Z954 Presence of other heart-valve replacement: Secondary | ICD-10-CM | POA: Diagnosis not present

## 2019-08-29 DIAGNOSIS — Z952 Presence of prosthetic heart valve: Secondary | ICD-10-CM | POA: Diagnosis not present

## 2019-08-29 DIAGNOSIS — Z951 Presence of aortocoronary bypass graft: Secondary | ICD-10-CM | POA: Diagnosis not present

## 2019-08-29 DIAGNOSIS — I493 Ventricular premature depolarization: Secondary | ICD-10-CM | POA: Diagnosis not present

## 2019-12-08 DIAGNOSIS — I2510 Atherosclerotic heart disease of native coronary artery without angina pectoris: Secondary | ICD-10-CM | POA: Diagnosis not present

## 2019-12-08 DIAGNOSIS — E785 Hyperlipidemia, unspecified: Secondary | ICD-10-CM | POA: Diagnosis not present

## 2019-12-08 DIAGNOSIS — E049 Nontoxic goiter, unspecified: Secondary | ICD-10-CM | POA: Diagnosis not present

## 2019-12-08 DIAGNOSIS — Z0000 Encounter for general adult medical examination without abnormal findings: Secondary | ICD-10-CM | POA: Diagnosis not present

## 2019-12-08 DIAGNOSIS — R739 Hyperglycemia, unspecified: Secondary | ICD-10-CM | POA: Diagnosis not present

## 2019-12-08 DIAGNOSIS — I10 Essential (primary) hypertension: Secondary | ICD-10-CM | POA: Diagnosis not present

## 2019-12-08 DIAGNOSIS — Z79899 Other long term (current) drug therapy: Secondary | ICD-10-CM | POA: Diagnosis not present

## 2019-12-08 DIAGNOSIS — Z9889 Other specified postprocedural states: Secondary | ICD-10-CM | POA: Diagnosis not present

## 2019-12-19 DIAGNOSIS — Z789 Other specified health status: Secondary | ICD-10-CM | POA: Diagnosis not present

## 2019-12-19 DIAGNOSIS — Z6838 Body mass index (BMI) 38.0-38.9, adult: Secondary | ICD-10-CM | POA: Diagnosis not present

## 2019-12-19 DIAGNOSIS — D500 Iron deficiency anemia secondary to blood loss (chronic): Secondary | ICD-10-CM | POA: Diagnosis not present

## 2020-01-05 DIAGNOSIS — D500 Iron deficiency anemia secondary to blood loss (chronic): Secondary | ICD-10-CM | POA: Diagnosis not present

## 2020-01-05 DIAGNOSIS — Z79899 Other long term (current) drug therapy: Secondary | ICD-10-CM | POA: Diagnosis not present

## 2020-01-05 DIAGNOSIS — D509 Iron deficiency anemia, unspecified: Secondary | ICD-10-CM | POA: Diagnosis not present

## 2020-03-26 DIAGNOSIS — I493 Ventricular premature depolarization: Secondary | ICD-10-CM | POA: Diagnosis not present

## 2020-03-26 DIAGNOSIS — Z951 Presence of aortocoronary bypass graft: Secondary | ICD-10-CM | POA: Diagnosis not present

## 2020-03-26 DIAGNOSIS — Z952 Presence of prosthetic heart valve: Secondary | ICD-10-CM | POA: Diagnosis not present

## 2020-03-26 DIAGNOSIS — E785 Hyperlipidemia, unspecified: Secondary | ICD-10-CM | POA: Diagnosis not present

## 2020-03-26 DIAGNOSIS — Z954 Presence of other heart-valve replacement: Secondary | ICD-10-CM | POA: Diagnosis not present

## 2020-03-26 DIAGNOSIS — I10 Essential (primary) hypertension: Secondary | ICD-10-CM | POA: Diagnosis not present

## 2020-03-26 DIAGNOSIS — I480 Paroxysmal atrial fibrillation: Secondary | ICD-10-CM | POA: Diagnosis not present

## 2020-03-26 DIAGNOSIS — I2510 Atherosclerotic heart disease of native coronary artery without angina pectoris: Secondary | ICD-10-CM | POA: Diagnosis not present

## 2020-04-22 DIAGNOSIS — Z952 Presence of prosthetic heart valve: Secondary | ICD-10-CM | POA: Diagnosis not present

## 2020-04-22 DIAGNOSIS — Z954 Presence of other heart-valve replacement: Secondary | ICD-10-CM | POA: Diagnosis not present

## 2020-06-21 DIAGNOSIS — E049 Nontoxic goiter, unspecified: Secondary | ICD-10-CM | POA: Diagnosis not present

## 2020-06-21 DIAGNOSIS — G720 Drug-induced myopathy: Secondary | ICD-10-CM | POA: Diagnosis not present

## 2020-06-21 DIAGNOSIS — R609 Edema, unspecified: Secondary | ICD-10-CM | POA: Diagnosis not present

## 2020-06-21 DIAGNOSIS — Z79899 Other long term (current) drug therapy: Secondary | ICD-10-CM | POA: Diagnosis not present

## 2020-06-21 DIAGNOSIS — E785 Hyperlipidemia, unspecified: Secondary | ICD-10-CM | POA: Diagnosis not present

## 2020-06-21 DIAGNOSIS — T466X5A Adverse effect of antihyperlipidemic and antiarteriosclerotic drugs, initial encounter: Secondary | ICD-10-CM | POA: Diagnosis not present

## 2020-07-19 DIAGNOSIS — R609 Edema, unspecified: Secondary | ICD-10-CM | POA: Diagnosis not present

## 2020-07-19 DIAGNOSIS — E785 Hyperlipidemia, unspecified: Secondary | ICD-10-CM | POA: Diagnosis not present

## 2020-07-19 DIAGNOSIS — R7302 Impaired glucose tolerance (oral): Secondary | ICD-10-CM | POA: Diagnosis not present

## 2020-07-19 DIAGNOSIS — M6281 Muscle weakness (generalized): Secondary | ICD-10-CM | POA: Diagnosis not present

## 2020-07-19 DIAGNOSIS — Z79899 Other long term (current) drug therapy: Secondary | ICD-10-CM | POA: Diagnosis not present

## 2020-08-11 DIAGNOSIS — I2510 Atherosclerotic heart disease of native coronary artery without angina pectoris: Secondary | ICD-10-CM | POA: Diagnosis not present

## 2020-08-23 DIAGNOSIS — I2510 Atherosclerotic heart disease of native coronary artery without angina pectoris: Secondary | ICD-10-CM | POA: Diagnosis not present

## 2020-09-17 DIAGNOSIS — Z954 Presence of other heart-valve replacement: Secondary | ICD-10-CM | POA: Diagnosis not present

## 2020-09-17 DIAGNOSIS — I050 Rheumatic mitral stenosis: Secondary | ICD-10-CM

## 2020-09-17 DIAGNOSIS — I480 Paroxysmal atrial fibrillation: Secondary | ICD-10-CM | POA: Diagnosis not present

## 2020-09-17 DIAGNOSIS — Z952 Presence of prosthetic heart valve: Secondary | ICD-10-CM | POA: Diagnosis not present

## 2020-09-17 DIAGNOSIS — I2510 Atherosclerotic heart disease of native coronary artery without angina pectoris: Secondary | ICD-10-CM | POA: Diagnosis not present

## 2020-09-17 DIAGNOSIS — Z951 Presence of aortocoronary bypass graft: Secondary | ICD-10-CM | POA: Diagnosis not present

## 2020-09-17 DIAGNOSIS — I493 Ventricular premature depolarization: Secondary | ICD-10-CM | POA: Diagnosis not present

## 2020-09-17 DIAGNOSIS — E785 Hyperlipidemia, unspecified: Secondary | ICD-10-CM | POA: Diagnosis not present

## 2020-09-17 DIAGNOSIS — I10 Essential (primary) hypertension: Secondary | ICD-10-CM | POA: Diagnosis not present

## 2020-09-17 HISTORY — DX: Rheumatic mitral stenosis: I05.0

## 2020-11-24 DIAGNOSIS — I240 Acute coronary thrombosis not resulting in myocardial infarction: Secondary | ICD-10-CM | POA: Diagnosis not present

## 2020-11-24 DIAGNOSIS — I890 Lymphedema, not elsewhere classified: Secondary | ICD-10-CM | POA: Diagnosis not present

## 2020-11-24 DIAGNOSIS — R609 Edema, unspecified: Secondary | ICD-10-CM | POA: Diagnosis not present

## 2020-11-24 DIAGNOSIS — I259 Chronic ischemic heart disease, unspecified: Secondary | ICD-10-CM | POA: Diagnosis not present

## 2020-11-25 DIAGNOSIS — R29818 Other symptoms and signs involving the nervous system: Secondary | ICD-10-CM | POA: Diagnosis not present

## 2020-11-25 DIAGNOSIS — G319 Degenerative disease of nervous system, unspecified: Secondary | ICD-10-CM | POA: Diagnosis not present

## 2020-11-30 ENCOUNTER — Encounter: Payer: Self-pay | Admitting: Physician Assistant

## 2021-01-03 ENCOUNTER — Encounter: Payer: Self-pay | Admitting: Physician Assistant

## 2021-01-03 ENCOUNTER — Ambulatory Visit (INDEPENDENT_AMBULATORY_CARE_PROVIDER_SITE_OTHER): Payer: Medicare Other | Admitting: Physician Assistant

## 2021-01-03 ENCOUNTER — Other Ambulatory Visit (INDEPENDENT_AMBULATORY_CARE_PROVIDER_SITE_OTHER): Payer: Medicare Other

## 2021-01-03 ENCOUNTER — Other Ambulatory Visit: Payer: Self-pay

## 2021-01-03 VITALS — BP 119/76 | HR 72 | Ht 64.0 in | Wt 172.6 lb

## 2021-01-03 DIAGNOSIS — R413 Other amnesia: Secondary | ICD-10-CM

## 2021-01-03 LAB — TSH: TSH: 0.47 u[IU]/mL (ref 0.35–5.50)

## 2021-01-03 LAB — VITAMIN B12: Vitamin B-12: 538 pg/mL (ref 211–911)

## 2021-01-03 MED ORDER — MEMANTINE HCL 5 MG PO TABS: ORAL_TABLET | ORAL | 3 refills | Status: DC

## 2021-01-03 NOTE — Patient Instructions (Addendum)
It was a pleasure to see you today at our office.   Recommendations:  Neurocognitive evaluation at our office Check labs today TSH and B12  Follow up once the results of the above are available   RECOMMENDATIONS FOR ALL PATIENTS WITH MEMORY PROBLEMS: 1. Continue to exercise (Recommend 30 minutes of walking everyday, or 3 hours every week) 2. Increase social interactions - continue going to [place] and enjoy social gatherings with friends and family 3. Eat healthy, avoid fried foods and eat more fruits and vegetables 4. Maintain adequate blood pressure, blood sugar, and blood cholesterol level. Reducing the risk of stroke and cardiovascular disease also helps promoting better memory. 5. Avoid stressful situations. Live a simple life and avoid aggravations. Organize your time and prepare for the next day in anticipation. 6. Sleep well, avoid any interruptions of sleep and avoid any distractions in the bedroom that may interfere with adequate sleep quality 7. Avoid sugar, avoid sweets as there is a strong link between excessive sugar intake, diabetes, and cognitive impairment We discussed the Mediterranean diet, which has been shown to help patients reduce the risk of progressive memory disorders and reduces cardiovascular risk. This includes eating fish, eat fruits and green leafy vegetables, nuts like almonds and hazelnuts, walnuts, and also use olive oil. Avoid fast foods and fried foods as much as possible. Avoid sweets and sugar as sugar use has been linked to worsening of memory function.  There is always a concern of gradual progression of memory problems. If this is the case, then we may need to adjust level of care according to patient needs. Support, both to the patient and caregiver, should then be put into place.      You have been referred for a neuropsychological evaluation (i.e., evaluation of memory and thinking abilities). Please bring someone with you to this appointment if  possible, as it is helpful for the doctor to hear from both you and another adult who knows you well. Please bring eyeglasses and hearing aids if you wear them.    The evaluation will take approximately 3 hours and has two parts:   The first part is a clinical interview with the neuropsychologist (Dr. Melvyn Novas or Dr. Nicole Kindred). During the interview, the neuropsychologist will speak with you and the individual you brought to the appointment.    The second part of the evaluation is testing with the doctor's technician Hinton Dyer or Maudie Mercury). During the testing, the technician will ask you to remember different types of material, solve problems, and answer some questionnaires. Your family member will not be present for this portion of the evaluation.   Please note: We must reserve several hours of the neuropsychologist's time and the psychometrician's time for your evaluation appointment. As such, there is a No-Show fee of $100. If you are unable to attend any of your appointments, please contact our office as soon as possible to reschedule.    FALL PRECAUTIONS: Be cautious when walking. Scan the area for obstacles that may increase the risk of trips and falls. When getting up in the mornings, sit up at the edge of the bed for a few minutes before getting out of bed. Consider elevating the bed at the head end to avoid drop of blood pressure when getting up. Walk always in a well-lit room (use night lights in the walls). Avoid area rugs or power cords from appliances in the middle of the walkways. Use a walker or a cane if necessary and consider physical therapy for  balance exercise. Get your eyesight checked regularly.  FINANCIAL OVERSIGHT: Supervision, especially oversight when making financial decisions or transactions is also recommended.  HOME SAFETY: Consider the safety of the kitchen when operating appliances like stoves, microwave oven, and blender. Consider having supervision and share cooking responsibilities  until no longer able to participate in those. Accidents with firearms and other hazards in the house should be identified and addressed as well.   ABILITY TO BE LEFT ALONE: If patient is unable to contact 911 operator, consider using LifeLine, or when the need is there, arrange for someone to stay with patients. Smoking is a fire hazard, consider supervision or cessation. Risk of wandering should be assessed by caregiver and if detected at any point, supervision and safe proof recommendations should be instituted.  MEDICATION SUPERVISION: Inability to self-administer medication needs to be constantly addressed. Implement a mechanism to ensure safe administration of the medications.   DRIVING: Regarding driving, in patients with progressive memory problems, driving will be impaired. We advise to have someone else do the driving if trouble finding directions or if minor accidents are reported. Independent driving assessment is available to determine safety of driving.   If you are interested in the driving assessment, you can contact the following:  [employer] in [place] [phone]  [facility] [phone]  [facility] [phone]  [facility] [phone] or [phone]    Mediterranean Diet A Mediterranean diet refers to food and lifestyle choices that are based on the traditions of countries located on the [place]. This way of eating has been shown to help prevent certain conditions and improve outcomes for people who have chronic diseases, like kidney disease and heart disease. What are tips for following this plan? Lifestyle  Cook and eat meals together with your family, when possible. Drink enough fluid to keep your urine clear or pale yellow. Be physically active every day. This includes: Aerobic exercise like running or swimming. Leisure activities like gardening, walking, or housework. Get 7-8 hours of sleep each  night. If recommended by your health care provider, drink red wine in moderation. This means 1 glass a day for nonpregnant women and 2 glasses a day for men. A glass of wine equals 5 oz (150 mL). Reading food labels  Check the serving size of packaged foods. For foods such as rice and pasta, the serving size refers to the amount of cooked product, not dry. Check the total fat in packaged foods. Avoid foods that have saturated fat or trans fats. Check the ingredients list for added sugars, such as corn syrup. Shopping  At the grocery store, buy most of your food from the areas near the walls of the store. This includes: Fresh fruits and vegetables (produce). Grains, beans, nuts, and seeds. Some of these may be available in unpackaged forms or large amounts (in bulk). Fresh seafood. Poultry and eggs. Low-fat dairy products. Buy whole ingredients instead of prepackaged foods. Buy fresh fruits and vegetables in-season from local farmers markets. Buy frozen fruits and vegetables in resealable bags. If you do not have access to quality fresh seafood, buy precooked frozen shrimp or canned fish, such as tuna, salmon, or sardines. Buy small amounts of raw or cooked vegetables, salads, or olives from the deli or salad bar at your store. Stock your pantry so you always have certain foods on hand, such as olive oil, canned tuna, canned tomatoes, rice, pasta, and beans. Cooking  Cook foods with extra-virgin olive oil instead of   using butter or other vegetable oils. Have meat as a side dish, and have vegetables or grains as your main dish. This means having meat in small portions or adding small amounts of meat to foods like pasta or stew. Use beans or vegetables instead of meat in common dishes like chili or lasagna. Experiment with different cooking methods. Try roasting or broiling vegetables instead of steaming or sauteing them. Add frozen vegetables to soups, stews, pasta, or rice. Add nuts or seeds  for added healthy fat at each meal. You can add these to yogurt, salads, or vegetable dishes. Marinate fish or vegetables using olive oil, lemon juice, garlic, and fresh herbs. Meal planning  Plan to eat 1 vegetarian meal one day each week. Try to work up to 2 vegetarian meals, if possible. Eat seafood 2 or more times a week. Have healthy snacks readily available, such as: Vegetable sticks with hummus. Greek yogurt. Fruit and nut trail mix. Eat balanced meals throughout the week. This includes: Fruit: 2-3 servings a day Vegetables: 4-5 servings a day Low-fat dairy: 2 servings a day Fish, poultry, or lean meat: 1 serving a day Beans and legumes: 2 or more servings a week Nuts and seeds: 1-2 servings a day Whole grains: 6-8 servings a day Extra-virgin olive oil: 3-4 servings a day Limit red meat and sweets to only a few servings a month What are my food choices? Mediterranean diet Recommended Grains: Whole-grain pasta. Brown rice. Bulgar wheat. Polenta. Couscous. Whole-wheat bread. Oatmeal. Quinoa. Vegetables: Artichokes. Beets. Broccoli. Cabbage. Carrots. Eggplant. Green beans. Chard. Kale. Spinach. Onions. Leeks. Peas. Squash. Tomatoes. Peppers. Radishes. Fruits: Apples. Apricots. Avocado. Berries. Bananas. Cherries. Dates. Figs. Grapes. Lemons. Melon. Oranges. Peaches. Plums. Pomegranate. Meats and other protein foods: Beans. Almonds. Sunflower seeds. Pine nuts. Peanuts. Cod. Salmon. Scallops. Shrimp. Tuna. Tilapia. Clams. Oysters. Eggs. Dairy: Low-fat milk. Cheese. Greek yogurt. Beverages: Water. Red wine. Herbal tea. Fats and oils: Extra virgin olive oil. Avocado oil. Grape seed oil. Sweets and desserts: Greek yogurt with honey. Baked apples. Poached pears. Trail mix. Seasoning and other foods: Basil. Cilantro. Coriander. Cumin. Mint. Parsley. Sage. Rosemary. Tarragon. Garlic. Oregano. Thyme. Pepper. Balsalmic vinegar. Tahini. Hummus. Tomato sauce. Olives. Mushrooms. Limit  these Grains: Prepackaged pasta or rice dishes. Prepackaged cereal with added sugar. Vegetables: Deep fried potatoes (french fries). Fruits: Fruit canned in syrup. Meats and other protein foods: Beef. Pork. Lamb. Poultry with skin. Hot dogs. Bacon. Dairy: Ice cream. Sour cream. Whole milk. Beverages: Juice. Sugar-sweetened soft drinks. Beer. Liquor and spirits. Fats and oils: Butter. Canola oil. Vegetable oil. Beef fat (tallow). Lard. Sweets and desserts: Cookies. Cakes. Pies. Candy. Seasoning and other foods: Mayonnaise. Premade sauces and marinades. The items listed may not be a complete list. Talk with your dietitian about what dietary choices are right for you. Summary The Mediterranean diet includes both food and lifestyle choices. Eat a variety of fresh fruits and vegetables, beans, nuts, seeds, and whole grains. Limit the amount of red meat and sweets that you eat. Talk with your health care provider about whether it is safe for you to drink red wine in moderation. This means 1 glass a day for nonpregnant women and 2 glasses a day for men. A glass of wine equals 5 oz (150 mL). This information is not intended to replace advice given to you by your health care provider. Make sure you discuss any questions you have with your health care provider. Document Released: 12/23/2015 Document Revised: 01/25/2016 Document Reviewed: 12/23/2015 Elsevier Interactive Patient   Education  2017 [employer]     
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
## 2021-01-04 NOTE — Progress Notes (Signed)
Advised of labs and voiced understanding of labs

## 2021-01-09 DIAGNOSIS — Z7982 Long term (current) use of aspirin: Secondary | ICD-10-CM | POA: Insufficient documentation

## 2021-01-13 DIAGNOSIS — Z954 Presence of other heart-valve replacement: Secondary | ICD-10-CM | POA: Diagnosis not present

## 2021-01-13 DIAGNOSIS — I480 Paroxysmal atrial fibrillation: Secondary | ICD-10-CM | POA: Diagnosis not present

## 2021-01-13 DIAGNOSIS — F0280 Dementia in other diseases classified elsewhere without behavioral disturbance: Secondary | ICD-10-CM

## 2021-01-13 DIAGNOSIS — G309 Alzheimer's disease, unspecified: Secondary | ICD-10-CM

## 2021-01-13 DIAGNOSIS — I499 Cardiac arrhythmia, unspecified: Secondary | ICD-10-CM | POA: Diagnosis not present

## 2021-01-13 DIAGNOSIS — I050 Rheumatic mitral stenosis: Secondary | ICD-10-CM | POA: Diagnosis not present

## 2021-01-13 DIAGNOSIS — Z951 Presence of aortocoronary bypass graft: Secondary | ICD-10-CM | POA: Diagnosis not present

## 2021-01-13 DIAGNOSIS — I2510 Atherosclerotic heart disease of native coronary artery without angina pectoris: Secondary | ICD-10-CM | POA: Diagnosis not present

## 2021-01-13 DIAGNOSIS — Z952 Presence of prosthetic heart valve: Secondary | ICD-10-CM | POA: Diagnosis not present

## 2021-01-13 DIAGNOSIS — E785 Hyperlipidemia, unspecified: Secondary | ICD-10-CM | POA: Diagnosis not present

## 2021-01-13 HISTORY — DX: Dementia in other diseases classified elsewhere, unspecified severity, without behavioral disturbance, psychotic disturbance, mood disturbance, and anxiety: F02.80

## 2021-01-13 HISTORY — DX: Alzheimer's disease, unspecified: G30.9

## 2021-01-21 DIAGNOSIS — I890 Lymphedema, not elsewhere classified: Secondary | ICD-10-CM | POA: Diagnosis not present

## 2021-01-21 DIAGNOSIS — R296 Repeated falls: Secondary | ICD-10-CM | POA: Diagnosis not present

## 2021-01-25 DIAGNOSIS — I890 Lymphedema, not elsewhere classified: Secondary | ICD-10-CM | POA: Diagnosis not present

## 2021-01-25 DIAGNOSIS — R296 Repeated falls: Secondary | ICD-10-CM | POA: Diagnosis not present

## 2021-01-26 DIAGNOSIS — Z961 Presence of intraocular lens: Secondary | ICD-10-CM | POA: Diagnosis not present

## 2021-01-26 DIAGNOSIS — H5203 Hypermetropia, bilateral: Secondary | ICD-10-CM | POA: Diagnosis not present

## 2021-01-26 DIAGNOSIS — H47323 Drusen of optic disc, bilateral: Secondary | ICD-10-CM | POA: Diagnosis not present

## 2021-01-31 DIAGNOSIS — I890 Lymphedema, not elsewhere classified: Secondary | ICD-10-CM | POA: Diagnosis not present

## 2021-01-31 DIAGNOSIS — R296 Repeated falls: Secondary | ICD-10-CM | POA: Diagnosis not present

## 2021-02-02 DIAGNOSIS — I4819 Other persistent atrial fibrillation: Secondary | ICD-10-CM | POA: Diagnosis not present

## 2021-02-02 DIAGNOSIS — D6869 Other thrombophilia: Secondary | ICD-10-CM | POA: Diagnosis not present

## 2021-02-02 DIAGNOSIS — E049 Nontoxic goiter, unspecified: Secondary | ICD-10-CM | POA: Diagnosis not present

## 2021-02-02 DIAGNOSIS — Z6834 Body mass index (BMI) 34.0-34.9, adult: Secondary | ICD-10-CM | POA: Diagnosis not present

## 2021-02-02 DIAGNOSIS — I70213 Atherosclerosis of native arteries of extremities with intermittent claudication, bilateral legs: Secondary | ICD-10-CM | POA: Diagnosis not present

## 2021-02-02 DIAGNOSIS — D649 Anemia, unspecified: Secondary | ICD-10-CM | POA: Diagnosis not present

## 2021-02-02 DIAGNOSIS — I240 Acute coronary thrombosis not resulting in myocardial infarction: Secondary | ICD-10-CM | POA: Diagnosis not present

## 2021-02-02 DIAGNOSIS — G720 Drug-induced myopathy: Secondary | ICD-10-CM | POA: Diagnosis not present

## 2021-02-02 DIAGNOSIS — E785 Hyperlipidemia, unspecified: Secondary | ICD-10-CM | POA: Diagnosis not present

## 2021-02-02 DIAGNOSIS — I25119 Atherosclerotic heart disease of native coronary artery with unspecified angina pectoris: Secondary | ICD-10-CM | POA: Diagnosis not present

## 2021-02-02 DIAGNOSIS — I10 Essential (primary) hypertension: Secondary | ICD-10-CM | POA: Diagnosis not present

## 2021-02-02 DIAGNOSIS — I890 Lymphedema, not elsewhere classified: Secondary | ICD-10-CM | POA: Diagnosis not present

## 2021-02-07 DIAGNOSIS — I25119 Atherosclerotic heart disease of native coronary artery with unspecified angina pectoris: Secondary | ICD-10-CM | POA: Diagnosis not present

## 2021-02-07 DIAGNOSIS — I890 Lymphedema, not elsewhere classified: Secondary | ICD-10-CM | POA: Diagnosis not present

## 2021-02-07 DIAGNOSIS — I70213 Atherosclerosis of native arteries of extremities with intermittent claudication, bilateral legs: Secondary | ICD-10-CM | POA: Diagnosis not present

## 2021-02-07 DIAGNOSIS — E785 Hyperlipidemia, unspecified: Secondary | ICD-10-CM | POA: Diagnosis not present

## 2021-02-07 DIAGNOSIS — I10 Essential (primary) hypertension: Secondary | ICD-10-CM | POA: Diagnosis not present

## 2021-02-07 DIAGNOSIS — I240 Acute coronary thrombosis not resulting in myocardial infarction: Secondary | ICD-10-CM | POA: Diagnosis not present

## 2021-02-08 DIAGNOSIS — M21162 Varus deformity, not elsewhere classified, left knee: Secondary | ICD-10-CM | POA: Diagnosis not present

## 2021-02-08 DIAGNOSIS — I240 Acute coronary thrombosis not resulting in myocardial infarction: Secondary | ICD-10-CM | POA: Diagnosis not present

## 2021-02-08 DIAGNOSIS — I10 Essential (primary) hypertension: Secondary | ICD-10-CM | POA: Diagnosis not present

## 2021-02-08 DIAGNOSIS — M25562 Pain in left knee: Secondary | ICD-10-CM | POA: Diagnosis not present

## 2021-02-08 DIAGNOSIS — I25119 Atherosclerotic heart disease of native coronary artery with unspecified angina pectoris: Secondary | ICD-10-CM | POA: Diagnosis not present

## 2021-02-08 DIAGNOSIS — M1712 Unilateral primary osteoarthritis, left knee: Secondary | ICD-10-CM | POA: Diagnosis not present

## 2021-02-08 DIAGNOSIS — I890 Lymphedema, not elsewhere classified: Secondary | ICD-10-CM | POA: Diagnosis not present

## 2021-02-08 DIAGNOSIS — I70213 Atherosclerosis of native arteries of extremities with intermittent claudication, bilateral legs: Secondary | ICD-10-CM | POA: Diagnosis not present

## 2021-02-08 DIAGNOSIS — E785 Hyperlipidemia, unspecified: Secondary | ICD-10-CM | POA: Diagnosis not present

## 2021-02-09 DIAGNOSIS — E785 Hyperlipidemia, unspecified: Secondary | ICD-10-CM | POA: Diagnosis not present

## 2021-02-09 DIAGNOSIS — I890 Lymphedema, not elsewhere classified: Secondary | ICD-10-CM | POA: Diagnosis not present

## 2021-02-09 DIAGNOSIS — I70213 Atherosclerosis of native arteries of extremities with intermittent claudication, bilateral legs: Secondary | ICD-10-CM | POA: Diagnosis not present

## 2021-02-09 DIAGNOSIS — I25119 Atherosclerotic heart disease of native coronary artery with unspecified angina pectoris: Secondary | ICD-10-CM | POA: Diagnosis not present

## 2021-02-09 DIAGNOSIS — I10 Essential (primary) hypertension: Secondary | ICD-10-CM | POA: Diagnosis not present

## 2021-02-09 DIAGNOSIS — I240 Acute coronary thrombosis not resulting in myocardial infarction: Secondary | ICD-10-CM | POA: Diagnosis not present

## 2021-02-10 DIAGNOSIS — I25119 Atherosclerotic heart disease of native coronary artery with unspecified angina pectoris: Secondary | ICD-10-CM | POA: Diagnosis not present

## 2021-02-10 DIAGNOSIS — I70213 Atherosclerosis of native arteries of extremities with intermittent claudication, bilateral legs: Secondary | ICD-10-CM | POA: Diagnosis not present

## 2021-02-10 DIAGNOSIS — I890 Lymphedema, not elsewhere classified: Secondary | ICD-10-CM | POA: Diagnosis not present

## 2021-02-10 DIAGNOSIS — I240 Acute coronary thrombosis not resulting in myocardial infarction: Secondary | ICD-10-CM | POA: Diagnosis not present

## 2021-02-10 DIAGNOSIS — E785 Hyperlipidemia, unspecified: Secondary | ICD-10-CM | POA: Diagnosis not present

## 2021-02-10 DIAGNOSIS — I10 Essential (primary) hypertension: Secondary | ICD-10-CM | POA: Diagnosis not present

## 2021-02-11 DIAGNOSIS — I70213 Atherosclerosis of native arteries of extremities with intermittent claudication, bilateral legs: Secondary | ICD-10-CM | POA: Diagnosis not present

## 2021-02-11 DIAGNOSIS — I240 Acute coronary thrombosis not resulting in myocardial infarction: Secondary | ICD-10-CM | POA: Diagnosis not present

## 2021-02-11 DIAGNOSIS — E785 Hyperlipidemia, unspecified: Secondary | ICD-10-CM | POA: Diagnosis not present

## 2021-02-11 DIAGNOSIS — I890 Lymphedema, not elsewhere classified: Secondary | ICD-10-CM | POA: Diagnosis not present

## 2021-02-11 DIAGNOSIS — I25119 Atherosclerotic heart disease of native coronary artery with unspecified angina pectoris: Secondary | ICD-10-CM | POA: Diagnosis not present

## 2021-02-11 DIAGNOSIS — I10 Essential (primary) hypertension: Secondary | ICD-10-CM | POA: Diagnosis not present

## 2021-02-14 DIAGNOSIS — I890 Lymphedema, not elsewhere classified: Secondary | ICD-10-CM | POA: Diagnosis not present

## 2021-02-14 DIAGNOSIS — I240 Acute coronary thrombosis not resulting in myocardial infarction: Secondary | ICD-10-CM | POA: Diagnosis not present

## 2021-02-14 DIAGNOSIS — I10 Essential (primary) hypertension: Secondary | ICD-10-CM | POA: Diagnosis not present

## 2021-02-14 DIAGNOSIS — E785 Hyperlipidemia, unspecified: Secondary | ICD-10-CM | POA: Diagnosis not present

## 2021-02-14 DIAGNOSIS — I70213 Atherosclerosis of native arteries of extremities with intermittent claudication, bilateral legs: Secondary | ICD-10-CM | POA: Diagnosis not present

## 2021-02-14 DIAGNOSIS — I25119 Atherosclerotic heart disease of native coronary artery with unspecified angina pectoris: Secondary | ICD-10-CM | POA: Diagnosis not present

## 2021-02-15 DIAGNOSIS — I10 Essential (primary) hypertension: Secondary | ICD-10-CM | POA: Diagnosis not present

## 2021-02-15 DIAGNOSIS — I25119 Atherosclerotic heart disease of native coronary artery with unspecified angina pectoris: Secondary | ICD-10-CM | POA: Diagnosis not present

## 2021-02-15 DIAGNOSIS — E785 Hyperlipidemia, unspecified: Secondary | ICD-10-CM | POA: Diagnosis not present

## 2021-02-15 DIAGNOSIS — I240 Acute coronary thrombosis not resulting in myocardial infarction: Secondary | ICD-10-CM | POA: Diagnosis not present

## 2021-02-15 DIAGNOSIS — I70213 Atherosclerosis of native arteries of extremities with intermittent claudication, bilateral legs: Secondary | ICD-10-CM | POA: Diagnosis not present

## 2021-02-15 DIAGNOSIS — I890 Lymphedema, not elsewhere classified: Secondary | ICD-10-CM | POA: Diagnosis not present

## 2021-02-16 DIAGNOSIS — I240 Acute coronary thrombosis not resulting in myocardial infarction: Secondary | ICD-10-CM | POA: Diagnosis not present

## 2021-02-16 DIAGNOSIS — I70213 Atherosclerosis of native arteries of extremities with intermittent claudication, bilateral legs: Secondary | ICD-10-CM | POA: Diagnosis not present

## 2021-02-16 DIAGNOSIS — E785 Hyperlipidemia, unspecified: Secondary | ICD-10-CM | POA: Diagnosis not present

## 2021-02-16 DIAGNOSIS — I890 Lymphedema, not elsewhere classified: Secondary | ICD-10-CM | POA: Diagnosis not present

## 2021-02-16 DIAGNOSIS — I25119 Atherosclerotic heart disease of native coronary artery with unspecified angina pectoris: Secondary | ICD-10-CM | POA: Diagnosis not present

## 2021-02-16 DIAGNOSIS — I10 Essential (primary) hypertension: Secondary | ICD-10-CM | POA: Diagnosis not present

## 2021-02-18 DIAGNOSIS — I890 Lymphedema, not elsewhere classified: Secondary | ICD-10-CM | POA: Diagnosis not present

## 2021-02-18 DIAGNOSIS — R7301 Impaired fasting glucose: Secondary | ICD-10-CM | POA: Diagnosis not present

## 2021-02-18 DIAGNOSIS — E049 Nontoxic goiter, unspecified: Secondary | ICD-10-CM | POA: Diagnosis not present

## 2021-02-18 DIAGNOSIS — M1712 Unilateral primary osteoarthritis, left knee: Secondary | ICD-10-CM | POA: Diagnosis not present

## 2021-02-18 DIAGNOSIS — I10 Essential (primary) hypertension: Secondary | ICD-10-CM | POA: Diagnosis not present

## 2021-02-18 DIAGNOSIS — I25119 Atherosclerotic heart disease of native coronary artery with unspecified angina pectoris: Secondary | ICD-10-CM | POA: Diagnosis not present

## 2021-02-18 DIAGNOSIS — E785 Hyperlipidemia, unspecified: Secondary | ICD-10-CM | POA: Diagnosis not present

## 2021-02-18 DIAGNOSIS — I70213 Atherosclerosis of native arteries of extremities with intermittent claudication, bilateral legs: Secondary | ICD-10-CM | POA: Diagnosis not present

## 2021-02-18 DIAGNOSIS — Z79899 Other long term (current) drug therapy: Secondary | ICD-10-CM | POA: Diagnosis not present

## 2021-02-18 DIAGNOSIS — Z0000 Encounter for general adult medical examination without abnormal findings: Secondary | ICD-10-CM | POA: Diagnosis not present

## 2021-02-18 DIAGNOSIS — I240 Acute coronary thrombosis not resulting in myocardial infarction: Secondary | ICD-10-CM | POA: Diagnosis not present

## 2021-02-18 DIAGNOSIS — Z01818 Encounter for other preprocedural examination: Secondary | ICD-10-CM | POA: Diagnosis not present

## 2021-02-21 DIAGNOSIS — I25119 Atherosclerotic heart disease of native coronary artery with unspecified angina pectoris: Secondary | ICD-10-CM | POA: Diagnosis not present

## 2021-02-21 DIAGNOSIS — E785 Hyperlipidemia, unspecified: Secondary | ICD-10-CM | POA: Diagnosis not present

## 2021-02-21 DIAGNOSIS — I10 Essential (primary) hypertension: Secondary | ICD-10-CM | POA: Diagnosis not present

## 2021-02-21 DIAGNOSIS — I240 Acute coronary thrombosis not resulting in myocardial infarction: Secondary | ICD-10-CM | POA: Diagnosis not present

## 2021-02-21 DIAGNOSIS — I890 Lymphedema, not elsewhere classified: Secondary | ICD-10-CM | POA: Diagnosis not present

## 2021-02-21 DIAGNOSIS — I70213 Atherosclerosis of native arteries of extremities with intermittent claudication, bilateral legs: Secondary | ICD-10-CM | POA: Diagnosis not present

## 2021-02-22 DIAGNOSIS — E785 Hyperlipidemia, unspecified: Secondary | ICD-10-CM | POA: Diagnosis not present

## 2021-02-22 DIAGNOSIS — I890 Lymphedema, not elsewhere classified: Secondary | ICD-10-CM | POA: Diagnosis not present

## 2021-02-22 DIAGNOSIS — I25119 Atherosclerotic heart disease of native coronary artery with unspecified angina pectoris: Secondary | ICD-10-CM | POA: Diagnosis not present

## 2021-02-22 DIAGNOSIS — I240 Acute coronary thrombosis not resulting in myocardial infarction: Secondary | ICD-10-CM | POA: Diagnosis not present

## 2021-02-22 DIAGNOSIS — I70213 Atherosclerosis of native arteries of extremities with intermittent claudication, bilateral legs: Secondary | ICD-10-CM | POA: Diagnosis not present

## 2021-02-22 DIAGNOSIS — I10 Essential (primary) hypertension: Secondary | ICD-10-CM | POA: Diagnosis not present

## 2021-02-23 DIAGNOSIS — I890 Lymphedema, not elsewhere classified: Secondary | ICD-10-CM | POA: Diagnosis not present

## 2021-02-23 DIAGNOSIS — I10 Essential (primary) hypertension: Secondary | ICD-10-CM | POA: Diagnosis not present

## 2021-02-23 DIAGNOSIS — E785 Hyperlipidemia, unspecified: Secondary | ICD-10-CM | POA: Diagnosis not present

## 2021-02-23 DIAGNOSIS — I240 Acute coronary thrombosis not resulting in myocardial infarction: Secondary | ICD-10-CM | POA: Diagnosis not present

## 2021-02-23 DIAGNOSIS — I70213 Atherosclerosis of native arteries of extremities with intermittent claudication, bilateral legs: Secondary | ICD-10-CM | POA: Diagnosis not present

## 2021-02-23 DIAGNOSIS — I25119 Atherosclerotic heart disease of native coronary artery with unspecified angina pectoris: Secondary | ICD-10-CM | POA: Diagnosis not present

## 2021-02-24 DIAGNOSIS — I70213 Atherosclerosis of native arteries of extremities with intermittent claudication, bilateral legs: Secondary | ICD-10-CM | POA: Diagnosis not present

## 2021-02-24 DIAGNOSIS — I240 Acute coronary thrombosis not resulting in myocardial infarction: Secondary | ICD-10-CM | POA: Diagnosis not present

## 2021-02-24 DIAGNOSIS — I25119 Atherosclerotic heart disease of native coronary artery with unspecified angina pectoris: Secondary | ICD-10-CM | POA: Diagnosis not present

## 2021-02-24 DIAGNOSIS — E785 Hyperlipidemia, unspecified: Secondary | ICD-10-CM | POA: Diagnosis not present

## 2021-02-24 DIAGNOSIS — I890 Lymphedema, not elsewhere classified: Secondary | ICD-10-CM | POA: Diagnosis not present

## 2021-02-24 DIAGNOSIS — I10 Essential (primary) hypertension: Secondary | ICD-10-CM | POA: Diagnosis not present

## 2021-02-25 DIAGNOSIS — I240 Acute coronary thrombosis not resulting in myocardial infarction: Secondary | ICD-10-CM | POA: Diagnosis not present

## 2021-02-25 DIAGNOSIS — I25119 Atherosclerotic heart disease of native coronary artery with unspecified angina pectoris: Secondary | ICD-10-CM | POA: Diagnosis not present

## 2021-02-25 DIAGNOSIS — I10 Essential (primary) hypertension: Secondary | ICD-10-CM | POA: Diagnosis not present

## 2021-02-25 DIAGNOSIS — I890 Lymphedema, not elsewhere classified: Secondary | ICD-10-CM | POA: Diagnosis not present

## 2021-02-25 DIAGNOSIS — E785 Hyperlipidemia, unspecified: Secondary | ICD-10-CM | POA: Diagnosis not present

## 2021-02-25 DIAGNOSIS — I70213 Atherosclerosis of native arteries of extremities with intermittent claudication, bilateral legs: Secondary | ICD-10-CM | POA: Diagnosis not present

## 2021-02-28 DIAGNOSIS — I25119 Atherosclerotic heart disease of native coronary artery with unspecified angina pectoris: Secondary | ICD-10-CM | POA: Diagnosis not present

## 2021-02-28 DIAGNOSIS — E785 Hyperlipidemia, unspecified: Secondary | ICD-10-CM | POA: Diagnosis not present

## 2021-02-28 DIAGNOSIS — I240 Acute coronary thrombosis not resulting in myocardial infarction: Secondary | ICD-10-CM | POA: Diagnosis not present

## 2021-02-28 DIAGNOSIS — I10 Essential (primary) hypertension: Secondary | ICD-10-CM | POA: Diagnosis not present

## 2021-02-28 DIAGNOSIS — I70213 Atherosclerosis of native arteries of extremities with intermittent claudication, bilateral legs: Secondary | ICD-10-CM | POA: Diagnosis not present

## 2021-02-28 DIAGNOSIS — I890 Lymphedema, not elsewhere classified: Secondary | ICD-10-CM | POA: Diagnosis not present

## 2021-03-01 DIAGNOSIS — E785 Hyperlipidemia, unspecified: Secondary | ICD-10-CM | POA: Diagnosis not present

## 2021-03-01 DIAGNOSIS — I10 Essential (primary) hypertension: Secondary | ICD-10-CM | POA: Diagnosis not present

## 2021-03-01 DIAGNOSIS — I240 Acute coronary thrombosis not resulting in myocardial infarction: Secondary | ICD-10-CM | POA: Diagnosis not present

## 2021-03-01 DIAGNOSIS — I70213 Atherosclerosis of native arteries of extremities with intermittent claudication, bilateral legs: Secondary | ICD-10-CM | POA: Diagnosis not present

## 2021-03-01 DIAGNOSIS — I25119 Atherosclerotic heart disease of native coronary artery with unspecified angina pectoris: Secondary | ICD-10-CM | POA: Diagnosis not present

## 2021-03-01 DIAGNOSIS — I890 Lymphedema, not elsewhere classified: Secondary | ICD-10-CM | POA: Diagnosis not present

## 2021-03-03 DIAGNOSIS — Z952 Presence of prosthetic heart valve: Secondary | ICD-10-CM | POA: Diagnosis not present

## 2021-03-03 DIAGNOSIS — I050 Rheumatic mitral stenosis: Secondary | ICD-10-CM | POA: Diagnosis not present

## 2021-03-03 DIAGNOSIS — I240 Acute coronary thrombosis not resulting in myocardial infarction: Secondary | ICD-10-CM | POA: Diagnosis not present

## 2021-03-03 DIAGNOSIS — I70213 Atherosclerosis of native arteries of extremities with intermittent claudication, bilateral legs: Secondary | ICD-10-CM | POA: Diagnosis not present

## 2021-03-03 DIAGNOSIS — E785 Hyperlipidemia, unspecified: Secondary | ICD-10-CM | POA: Diagnosis not present

## 2021-03-03 DIAGNOSIS — I480 Paroxysmal atrial fibrillation: Secondary | ICD-10-CM | POA: Diagnosis not present

## 2021-03-03 DIAGNOSIS — I10 Essential (primary) hypertension: Secondary | ICD-10-CM | POA: Diagnosis not present

## 2021-03-03 DIAGNOSIS — I2510 Atherosclerotic heart disease of native coronary artery without angina pectoris: Secondary | ICD-10-CM | POA: Diagnosis not present

## 2021-03-03 DIAGNOSIS — I25119 Atherosclerotic heart disease of native coronary artery with unspecified angina pectoris: Secondary | ICD-10-CM | POA: Diagnosis not present

## 2021-03-03 DIAGNOSIS — I890 Lymphedema, not elsewhere classified: Secondary | ICD-10-CM | POA: Diagnosis not present

## 2021-03-04 DIAGNOSIS — E049 Nontoxic goiter, unspecified: Secondary | ICD-10-CM | POA: Diagnosis not present

## 2021-03-04 DIAGNOSIS — I70213 Atherosclerosis of native arteries of extremities with intermittent claudication, bilateral legs: Secondary | ICD-10-CM | POA: Diagnosis not present

## 2021-03-04 DIAGNOSIS — I25119 Atherosclerotic heart disease of native coronary artery with unspecified angina pectoris: Secondary | ICD-10-CM | POA: Diagnosis not present

## 2021-03-04 DIAGNOSIS — D6869 Other thrombophilia: Secondary | ICD-10-CM | POA: Diagnosis not present

## 2021-03-04 DIAGNOSIS — I890 Lymphedema, not elsewhere classified: Secondary | ICD-10-CM | POA: Diagnosis not present

## 2021-03-04 DIAGNOSIS — D649 Anemia, unspecified: Secondary | ICD-10-CM | POA: Diagnosis not present

## 2021-03-04 DIAGNOSIS — Z6834 Body mass index (BMI) 34.0-34.9, adult: Secondary | ICD-10-CM | POA: Diagnosis not present

## 2021-03-04 DIAGNOSIS — G720 Drug-induced myopathy: Secondary | ICD-10-CM | POA: Diagnosis not present

## 2021-03-04 DIAGNOSIS — I10 Essential (primary) hypertension: Secondary | ICD-10-CM | POA: Diagnosis not present

## 2021-03-04 DIAGNOSIS — E785 Hyperlipidemia, unspecified: Secondary | ICD-10-CM | POA: Diagnosis not present

## 2021-03-04 DIAGNOSIS — I4819 Other persistent atrial fibrillation: Secondary | ICD-10-CM | POA: Diagnosis not present

## 2021-03-04 DIAGNOSIS — I240 Acute coronary thrombosis not resulting in myocardial infarction: Secondary | ICD-10-CM | POA: Diagnosis not present

## 2021-03-07 DIAGNOSIS — I240 Acute coronary thrombosis not resulting in myocardial infarction: Secondary | ICD-10-CM | POA: Diagnosis not present

## 2021-03-07 DIAGNOSIS — I10 Essential (primary) hypertension: Secondary | ICD-10-CM | POA: Diagnosis not present

## 2021-03-07 DIAGNOSIS — E785 Hyperlipidemia, unspecified: Secondary | ICD-10-CM | POA: Diagnosis not present

## 2021-03-07 DIAGNOSIS — I70213 Atherosclerosis of native arteries of extremities with intermittent claudication, bilateral legs: Secondary | ICD-10-CM | POA: Diagnosis not present

## 2021-03-07 DIAGNOSIS — I25119 Atherosclerotic heart disease of native coronary artery with unspecified angina pectoris: Secondary | ICD-10-CM | POA: Diagnosis not present

## 2021-03-07 DIAGNOSIS — I890 Lymphedema, not elsewhere classified: Secondary | ICD-10-CM | POA: Diagnosis not present

## 2021-03-07 DIAGNOSIS — I2510 Atherosclerotic heart disease of native coronary artery without angina pectoris: Secondary | ICD-10-CM | POA: Diagnosis not present

## 2021-03-08 DIAGNOSIS — I050 Rheumatic mitral stenosis: Secondary | ICD-10-CM | POA: Diagnosis not present

## 2021-03-08 DIAGNOSIS — I240 Acute coronary thrombosis not resulting in myocardial infarction: Secondary | ICD-10-CM | POA: Diagnosis not present

## 2021-03-08 DIAGNOSIS — Z01810 Encounter for preprocedural cardiovascular examination: Secondary | ICD-10-CM | POA: Diagnosis not present

## 2021-03-08 DIAGNOSIS — Z952 Presence of prosthetic heart valve: Secondary | ICD-10-CM | POA: Diagnosis not present

## 2021-03-08 DIAGNOSIS — F0280 Dementia in other diseases classified elsewhere without behavioral disturbance: Secondary | ICD-10-CM | POA: Diagnosis not present

## 2021-03-08 DIAGNOSIS — I890 Lymphedema, not elsewhere classified: Secondary | ICD-10-CM | POA: Diagnosis not present

## 2021-03-08 DIAGNOSIS — I2510 Atherosclerotic heart disease of native coronary artery without angina pectoris: Secondary | ICD-10-CM | POA: Diagnosis not present

## 2021-03-08 DIAGNOSIS — I10 Essential (primary) hypertension: Secondary | ICD-10-CM | POA: Diagnosis not present

## 2021-03-08 DIAGNOSIS — G309 Alzheimer's disease, unspecified: Secondary | ICD-10-CM | POA: Diagnosis not present

## 2021-03-08 DIAGNOSIS — Z954 Presence of other heart-valve replacement: Secondary | ICD-10-CM | POA: Diagnosis not present

## 2021-03-08 DIAGNOSIS — I480 Paroxysmal atrial fibrillation: Secondary | ICD-10-CM | POA: Diagnosis not present

## 2021-03-08 DIAGNOSIS — I70213 Atherosclerosis of native arteries of extremities with intermittent claudication, bilateral legs: Secondary | ICD-10-CM | POA: Diagnosis not present

## 2021-03-08 DIAGNOSIS — E785 Hyperlipidemia, unspecified: Secondary | ICD-10-CM | POA: Diagnosis not present

## 2021-03-08 DIAGNOSIS — I25119 Atherosclerotic heart disease of native coronary artery with unspecified angina pectoris: Secondary | ICD-10-CM | POA: Diagnosis not present

## 2021-03-09 DIAGNOSIS — I25119 Atherosclerotic heart disease of native coronary artery with unspecified angina pectoris: Secondary | ICD-10-CM | POA: Diagnosis not present

## 2021-03-09 DIAGNOSIS — I890 Lymphedema, not elsewhere classified: Secondary | ICD-10-CM | POA: Diagnosis not present

## 2021-03-09 DIAGNOSIS — I10 Essential (primary) hypertension: Secondary | ICD-10-CM | POA: Diagnosis not present

## 2021-03-09 DIAGNOSIS — E785 Hyperlipidemia, unspecified: Secondary | ICD-10-CM | POA: Diagnosis not present

## 2021-03-09 DIAGNOSIS — I240 Acute coronary thrombosis not resulting in myocardial infarction: Secondary | ICD-10-CM | POA: Diagnosis not present

## 2021-03-09 DIAGNOSIS — I70213 Atherosclerosis of native arteries of extremities with intermittent claudication, bilateral legs: Secondary | ICD-10-CM | POA: Diagnosis not present

## 2021-03-10 DIAGNOSIS — I240 Acute coronary thrombosis not resulting in myocardial infarction: Secondary | ICD-10-CM | POA: Diagnosis not present

## 2021-03-10 DIAGNOSIS — I25119 Atherosclerotic heart disease of native coronary artery with unspecified angina pectoris: Secondary | ICD-10-CM | POA: Diagnosis not present

## 2021-03-10 DIAGNOSIS — E785 Hyperlipidemia, unspecified: Secondary | ICD-10-CM | POA: Diagnosis not present

## 2021-03-10 DIAGNOSIS — I70213 Atherosclerosis of native arteries of extremities with intermittent claudication, bilateral legs: Secondary | ICD-10-CM | POA: Diagnosis not present

## 2021-03-10 DIAGNOSIS — I10 Essential (primary) hypertension: Secondary | ICD-10-CM | POA: Diagnosis not present

## 2021-03-10 DIAGNOSIS — I890 Lymphedema, not elsewhere classified: Secondary | ICD-10-CM | POA: Diagnosis not present

## 2021-03-14 DIAGNOSIS — E785 Hyperlipidemia, unspecified: Secondary | ICD-10-CM | POA: Diagnosis not present

## 2021-03-14 DIAGNOSIS — I240 Acute coronary thrombosis not resulting in myocardial infarction: Secondary | ICD-10-CM | POA: Diagnosis not present

## 2021-03-14 DIAGNOSIS — I10 Essential (primary) hypertension: Secondary | ICD-10-CM | POA: Diagnosis not present

## 2021-03-14 DIAGNOSIS — I25119 Atherosclerotic heart disease of native coronary artery with unspecified angina pectoris: Secondary | ICD-10-CM | POA: Diagnosis not present

## 2021-03-14 DIAGNOSIS — I890 Lymphedema, not elsewhere classified: Secondary | ICD-10-CM | POA: Diagnosis not present

## 2021-03-14 DIAGNOSIS — I70213 Atherosclerosis of native arteries of extremities with intermittent claudication, bilateral legs: Secondary | ICD-10-CM | POA: Diagnosis not present

## 2021-03-15 ENCOUNTER — Telehealth: Payer: Self-pay | Admitting: Physician Assistant

## 2021-03-15 ENCOUNTER — Other Ambulatory Visit: Payer: Self-pay

## 2021-03-15 DIAGNOSIS — M25562 Pain in left knee: Secondary | ICD-10-CM | POA: Diagnosis not present

## 2021-03-15 MED ORDER — MEMANTINE HCL 5 MG PO TABS: ORAL_TABLET | ORAL | 3 refills | Status: DC

## 2021-03-15 NOTE — Telephone Encounter (Signed)
Pt's family member left a voice message stating the [facility] in [place] has been trying to get [place] to refill the patient's memory medication. They need to pick that up for her since the pt doesn't drive.

## 2021-03-15 NOTE — Telephone Encounter (Signed)
Sent rx request, thanks

## 2021-03-16 DIAGNOSIS — I890 Lymphedema, not elsewhere classified: Secondary | ICD-10-CM | POA: Diagnosis not present

## 2021-03-16 DIAGNOSIS — I25119 Atherosclerotic heart disease of native coronary artery with unspecified angina pectoris: Secondary | ICD-10-CM | POA: Diagnosis not present

## 2021-03-16 DIAGNOSIS — I240 Acute coronary thrombosis not resulting in myocardial infarction: Secondary | ICD-10-CM | POA: Diagnosis not present

## 2021-03-16 DIAGNOSIS — E785 Hyperlipidemia, unspecified: Secondary | ICD-10-CM | POA: Diagnosis not present

## 2021-03-16 DIAGNOSIS — I10 Essential (primary) hypertension: Secondary | ICD-10-CM | POA: Diagnosis not present

## 2021-03-16 DIAGNOSIS — I70213 Atherosclerosis of native arteries of extremities with intermittent claudication, bilateral legs: Secondary | ICD-10-CM | POA: Diagnosis not present

## 2021-03-17 DIAGNOSIS — R609 Edema, unspecified: Secondary | ICD-10-CM | POA: Diagnosis not present

## 2021-03-17 DIAGNOSIS — I890 Lymphedema, not elsewhere classified: Secondary | ICD-10-CM | POA: Diagnosis not present

## 2021-03-17 DIAGNOSIS — M1712 Unilateral primary osteoarthritis, left knee: Secondary | ICD-10-CM | POA: Diagnosis not present

## 2021-03-17 DIAGNOSIS — R0989 Other specified symptoms and signs involving the circulatory and respiratory systems: Secondary | ICD-10-CM | POA: Diagnosis not present

## 2021-03-21 DIAGNOSIS — I240 Acute coronary thrombosis not resulting in myocardial infarction: Secondary | ICD-10-CM | POA: Diagnosis not present

## 2021-03-21 DIAGNOSIS — I10 Essential (primary) hypertension: Secondary | ICD-10-CM | POA: Diagnosis not present

## 2021-03-21 DIAGNOSIS — I70213 Atherosclerosis of native arteries of extremities with intermittent claudication, bilateral legs: Secondary | ICD-10-CM | POA: Diagnosis not present

## 2021-03-21 DIAGNOSIS — I890 Lymphedema, not elsewhere classified: Secondary | ICD-10-CM | POA: Diagnosis not present

## 2021-03-21 DIAGNOSIS — I25119 Atherosclerotic heart disease of native coronary artery with unspecified angina pectoris: Secondary | ICD-10-CM | POA: Diagnosis not present

## 2021-03-21 DIAGNOSIS — E785 Hyperlipidemia, unspecified: Secondary | ICD-10-CM | POA: Diagnosis not present

## 2021-03-22 ENCOUNTER — Ambulatory Visit (INDEPENDENT_AMBULATORY_CARE_PROVIDER_SITE_OTHER): Payer: Medicare Other | Admitting: Family

## 2021-03-22 DIAGNOSIS — I890 Lymphedema, not elsewhere classified: Secondary | ICD-10-CM | POA: Diagnosis not present

## 2021-03-22 DIAGNOSIS — L03116 Cellulitis of left lower limb: Secondary | ICD-10-CM

## 2021-03-22 DIAGNOSIS — M25562 Pain in left knee: Secondary | ICD-10-CM

## 2021-03-22 MED ORDER — SULFAMETHOXAZOLE-TRIMETHOPRIM 800-160 MG PO TABS: 1.0000 | ORAL_TABLET | Freq: Two times a day (BID) | ORAL | 0 refills | Status: DC

## 2021-03-22 NOTE — Progress Notes (Signed)
Office Visit Note   Patient: Brandy Bowers           Date of Birth: 1951-01-21           MRN: [id number] Visit Date: 03/22/2021              Requested by: Emmaline Kluver, MD [address] [place],  [place] [address] PCP: Venetia Maxon, Sharon Mt, MD  Chief Complaint  Patient presents with   Left Knee - Pain    Scheduled for total knee replacement and wanting second opinion      HPI: The patient is a 70 year old woman seen for evaluation of swelling to her left lower extremity.  Does have a history of edema to bilateral lower extremities family that accompanies the visit reports history of lymphedema she is currently in the process of being set up with lymphedema pumps  Today she is seen referred by her orthopedic surgeon.  She is currently awaiting scheduling of a total knee arthroplasty on the left unfortunately due to some redness and swelling in her left lower extremity they are not comfortable proceeding with surgery until this is resolved she denies any fevers or chills no open ulceration.  She does have some CircAid wraps at home but she wears these only a few hours a day  Assessment & Plan: Visit Diagnoses:  1. Cellulitis of left leg   2. Lymphedema     Plan: We will place the patient on oral antibiotics.  We will place her in a Dynaflex compression wrap on the left.  Hopes that the cellulitis will resolve quickly  Follow-Up Instructions: Return in about 1 week (around 03/29/2021).   Ortho Exam  Patient is alert, oriented, no adenopathy, well-dressed, normal affect, normal respiratory effort. On examination of the bilateral lower extremities she does have pitting edema.  Hemosiderin staining bilaterally left worse than right.  Left significantly worse than right.  There is some erythema and warmth to the left lower extremity.  Shiny.  There is no open ulceration no weeping no ascending cellulitis  Imaging: No results found. No images are attached to the  encounter.  Labs: No results found for: HGBA1C, ESRSEDRATE, CRP, LABURIC, REPTSTATUS, GRAMSTAIN, CULT, LABORGA   No results found for: ALBUMIN, PREALBUMIN, CBC  No results found for: MG No results found for: VD25OH  No results found for: PREALBUMIN No flowsheet data found.   There is no height or weight on file to calculate BMI.  Orders:  No orders of the defined types were placed in this encounter.  Meds ordered this encounter  Medications   sulfamethoxazole-trimethoprim (BACTRIM DS) 800-160 MG tablet    Sig: Take 1 tablet by mouth 2 (two) times daily.    Dispense:  20 tablet    Refill:  0     Procedures: No procedures performed  Clinical Data: No additional findings.  ROS:  All other systems negative, except as noted in the HPI. Review of Systems  Constitutional:  Negative for chills and fever.  Cardiovascular:  Positive for leg swelling.  Skin:  Positive for color change. Negative for wound.   Objective: Vital Signs: There were no vitals taken for this visit.  Specialty Comments:  No specialty comments available.  PMFS History: There are no problems to display for this patient.  No past medical history on file.  Family History  Problem Relation Age of Onset   Cancer Mother    Cancer Father    Stroke Father  Past Surgical History:  Procedure Laterality Date   AORTIC VALVE REPLACEMENT     EXPLORATION POST OPERATIVE OPEN HEART     Social History   Occupational History   Not on file  Tobacco Use   Smoking status: Never   Smokeless tobacco: Never  Vaping Use   Vaping Use: Never used  Substance and Sexual Activity   Alcohol use: Never   Drug use: Never   Sexual activity: Not on file

## 2021-03-23 DIAGNOSIS — I890 Lymphedema, not elsewhere classified: Secondary | ICD-10-CM | POA: Diagnosis not present

## 2021-03-23 DIAGNOSIS — E785 Hyperlipidemia, unspecified: Secondary | ICD-10-CM | POA: Diagnosis not present

## 2021-03-23 DIAGNOSIS — I240 Acute coronary thrombosis not resulting in myocardial infarction: Secondary | ICD-10-CM | POA: Diagnosis not present

## 2021-03-23 DIAGNOSIS — I70213 Atherosclerosis of native arteries of extremities with intermittent claudication, bilateral legs: Secondary | ICD-10-CM | POA: Diagnosis not present

## 2021-03-23 DIAGNOSIS — I25119 Atherosclerotic heart disease of native coronary artery with unspecified angina pectoris: Secondary | ICD-10-CM | POA: Diagnosis not present

## 2021-03-23 DIAGNOSIS — I10 Essential (primary) hypertension: Secondary | ICD-10-CM | POA: Diagnosis not present

## 2021-03-28 DIAGNOSIS — I25119 Atherosclerotic heart disease of native coronary artery with unspecified angina pectoris: Secondary | ICD-10-CM | POA: Diagnosis not present

## 2021-03-28 DIAGNOSIS — I10 Essential (primary) hypertension: Secondary | ICD-10-CM | POA: Diagnosis not present

## 2021-03-28 DIAGNOSIS — I240 Acute coronary thrombosis not resulting in myocardial infarction: Secondary | ICD-10-CM | POA: Diagnosis not present

## 2021-03-28 DIAGNOSIS — I890 Lymphedema, not elsewhere classified: Secondary | ICD-10-CM | POA: Diagnosis not present

## 2021-03-28 DIAGNOSIS — I70213 Atherosclerosis of native arteries of extremities with intermittent claudication, bilateral legs: Secondary | ICD-10-CM | POA: Diagnosis not present

## 2021-03-28 DIAGNOSIS — E785 Hyperlipidemia, unspecified: Secondary | ICD-10-CM | POA: Diagnosis not present

## 2021-03-29 ENCOUNTER — Ambulatory Visit (INDEPENDENT_AMBULATORY_CARE_PROVIDER_SITE_OTHER): Payer: Medicare Other | Admitting: Family

## 2021-03-29 ENCOUNTER — Other Ambulatory Visit: Payer: Self-pay | Admitting: Orthopedic Surgery

## 2021-03-29 DIAGNOSIS — I240 Acute coronary thrombosis not resulting in myocardial infarction: Secondary | ICD-10-CM | POA: Diagnosis not present

## 2021-03-29 DIAGNOSIS — I70213 Atherosclerosis of native arteries of extremities with intermittent claudication, bilateral legs: Secondary | ICD-10-CM | POA: Diagnosis not present

## 2021-03-29 DIAGNOSIS — I890 Lymphedema, not elsewhere classified: Secondary | ICD-10-CM | POA: Diagnosis not present

## 2021-03-29 DIAGNOSIS — I10 Essential (primary) hypertension: Secondary | ICD-10-CM | POA: Diagnosis not present

## 2021-03-29 DIAGNOSIS — L03116 Cellulitis of left lower limb: Secondary | ICD-10-CM | POA: Diagnosis not present

## 2021-03-29 DIAGNOSIS — I872 Venous insufficiency (chronic) (peripheral): Secondary | ICD-10-CM

## 2021-03-29 DIAGNOSIS — M25562 Pain in left knee: Secondary | ICD-10-CM | POA: Diagnosis not present

## 2021-03-29 DIAGNOSIS — I25119 Atherosclerotic heart disease of native coronary artery with unspecified angina pectoris: Secondary | ICD-10-CM | POA: Diagnosis not present

## 2021-03-29 DIAGNOSIS — E785 Hyperlipidemia, unspecified: Secondary | ICD-10-CM | POA: Diagnosis not present

## 2021-03-30 ENCOUNTER — Telehealth: Payer: Self-pay | Admitting: Physician Assistant

## 2021-03-30 ENCOUNTER — Other Ambulatory Visit: Payer: Self-pay

## 2021-03-30 ENCOUNTER — Encounter: Payer: Self-pay | Admitting: Family

## 2021-03-30 DIAGNOSIS — I25119 Atherosclerotic heart disease of native coronary artery with unspecified angina pectoris: Secondary | ICD-10-CM | POA: Diagnosis not present

## 2021-03-30 DIAGNOSIS — I240 Acute coronary thrombosis not resulting in myocardial infarction: Secondary | ICD-10-CM | POA: Diagnosis not present

## 2021-03-30 DIAGNOSIS — I70213 Atherosclerosis of native arteries of extremities with intermittent claudication, bilateral legs: Secondary | ICD-10-CM | POA: Diagnosis not present

## 2021-03-30 DIAGNOSIS — I10 Essential (primary) hypertension: Secondary | ICD-10-CM | POA: Diagnosis not present

## 2021-03-30 DIAGNOSIS — I890 Lymphedema, not elsewhere classified: Secondary | ICD-10-CM | POA: Diagnosis not present

## 2021-03-30 DIAGNOSIS — E785 Hyperlipidemia, unspecified: Secondary | ICD-10-CM | POA: Diagnosis not present

## 2021-03-30 MED ORDER — MEMANTINE HCL 5 MG PO TABS: ORAL_TABLET | ORAL | 2 refills | Status: AC

## 2021-03-30 NOTE — Telephone Encounter (Signed)
Pt sister called an informed that updated script was sent to the pharmacy

## 2021-03-30 NOTE — Progress Notes (Signed)
Office Visit Note   Patient: Brandy Bowers           Date of Birth: [birth year]           MRN: [id number] Visit Date: 03/22/2021              Requested by: Emmaline Kluver, MD [address] [place],  [place] [address] PCP: Venetia Maxon, Sharon Mt, MD  Chief Complaint  Patient presents with   Left Knee - Pain    Scheduled for total knee replacement and wanting second opinion      HPI: The patient is a 70 year old woman seen in follow up for cellulitis to her left lower extremity.     Does have a history of edema to bilateral lower extremities family that accompanies the visit reports history of lymphedema she is currently in the process of being set up with lymphedema pumps. Does have circaid wraps but cannot don these independently.  She is seen in referral from Dr. Garen Lah.  She is currently awaiting scheduling of a total knee arthroplasty on the left  Has been on oral abx and in dynaflex wrap.  Assessment & Plan: Visit Diagnoses:  1. Cellulitis of left leg   2. Lymphedema     Plan: Pleased with the resolution of her cellulitis.  Will place in repeat Dynaflex wrap for compression.  She will get in with her orthopedic surgeon ASAP to schedule her total knee arthroplasty.  Call or return with any concerns.     Follow-Up Instructions: Return in about 1 week (around 03/29/2021).   Ortho Exam  Patient is alert, oriented, no adenopathy, well-dressed, normal affect, normal respiratory effort.  On examination of the left lower extremity she has good wrinkling of the skin from compression.  Resolved erythema and warmth.  Does have some hemosiderin staining bilaterally left worse than right.  There is no erythema no ulceration no warmth   Imaging: No results found. No images are attached to the encounter.  Labs: No results found for: HGBA1C, ESRSEDRATE, CRP, LABURIC, REPTSTATUS, GRAMSTAIN, CULT, LABORGA   No results found for: ALBUMIN, PREALBUMIN, CBC  No results  found for: MG No results found for: VD25OH  No results found for: PREALBUMIN No flowsheet data found.   There is no height or weight on file to calculate BMI.  Orders:  No orders of the defined types were placed in this encounter.  Meds ordered this encounter  Medications   sulfamethoxazole-trimethoprim (BACTRIM DS) 800-160 MG tablet    Sig: Take 1 tablet by mouth 2 (two) times daily.    Dispense:  20 tablet    Refill:  0     Procedures: No procedures performed  Clinical Data: No additional findings.  ROS:  All other systems negative, except as noted in the HPI. Review of Systems  Constitutional:  Negative for chills and fever.  Cardiovascular:  Positive for leg swelling.  Skin:  Positive for color change. Negative for wound.   Objective: Vital Signs: There were no vitals taken for this visit.  Specialty Comments:  No specialty comments available.  PMFS History: There are no problems to display for this patient.  No past medical history on file.  Family History  Problem Relation Age of Onset   Cancer Mother    Cancer Father    Stroke Father     Past Surgical History:  Procedure Laterality Date   AORTIC VALVE REPLACEMENT     EXPLORATION POST OPERATIVE OPEN HEART  Social History   Occupational History   Not on file  Tobacco Use   Smoking status: Never   Smokeless tobacco: Never  Vaping Use   Vaping Use: Never used  Substance and Sexual Activity   Alcohol use: Never   Drug use: Never   Sexual activity: Not on file

## 2021-03-30 NOTE — Telephone Encounter (Signed)
Pt's sister called in stating the patient is supposed to take 2 memantine pills a day, but the pharmacy is only giving them 30 pills. They need an updated prescription sent in for 60 pills so they don't keep running out too early.

## 2021-04-03 DIAGNOSIS — I70213 Atherosclerosis of native arteries of extremities with intermittent claudication, bilateral legs: Secondary | ICD-10-CM | POA: Diagnosis not present

## 2021-04-03 DIAGNOSIS — E049 Nontoxic goiter, unspecified: Secondary | ICD-10-CM | POA: Diagnosis not present

## 2021-04-03 DIAGNOSIS — G720 Drug-induced myopathy: Secondary | ICD-10-CM | POA: Diagnosis not present

## 2021-04-03 DIAGNOSIS — I240 Acute coronary thrombosis not resulting in myocardial infarction: Secondary | ICD-10-CM | POA: Diagnosis not present

## 2021-04-03 DIAGNOSIS — I25119 Atherosclerotic heart disease of native coronary artery with unspecified angina pectoris: Secondary | ICD-10-CM | POA: Diagnosis not present

## 2021-04-03 DIAGNOSIS — E785 Hyperlipidemia, unspecified: Secondary | ICD-10-CM | POA: Diagnosis not present

## 2021-04-03 DIAGNOSIS — I4819 Other persistent atrial fibrillation: Secondary | ICD-10-CM | POA: Diagnosis not present

## 2021-04-03 DIAGNOSIS — D649 Anemia, unspecified: Secondary | ICD-10-CM | POA: Diagnosis not present

## 2021-04-03 DIAGNOSIS — I10 Essential (primary) hypertension: Secondary | ICD-10-CM | POA: Diagnosis not present

## 2021-04-03 DIAGNOSIS — D6869 Other thrombophilia: Secondary | ICD-10-CM | POA: Diagnosis not present

## 2021-04-03 DIAGNOSIS — Z6834 Body mass index (BMI) 34.0-34.9, adult: Secondary | ICD-10-CM | POA: Diagnosis not present

## 2021-04-03 DIAGNOSIS — I890 Lymphedema, not elsewhere classified: Secondary | ICD-10-CM | POA: Diagnosis not present

## 2021-04-04 DIAGNOSIS — M25562 Pain in left knee: Secondary | ICD-10-CM | POA: Diagnosis not present

## 2021-04-04 NOTE — Progress Notes (Signed)
Sent message, via epic in basket, requesting orders in epic from surgeon.  

## 2021-04-04 NOTE — H&P (Signed)
KNEE ARTHROPLASTY ADMISSION H&P  Patient ID: Brandy Bowers MRN: [id number] DOB/AGE: October 17, 1950 70 y.o.  Chief Complaint: left knee pain.  Planned Procedure Date: 04/27/21 Medical Clearance by Dr. Venetia Maxon   Cardiac Clearance by Dr. Beatrix Fetters   HPI: Brandy Bowers is a 70 y.o. female who presents for evaluation of OA LEFT KNEE. The patient has a history of pain and functional disability in the left knee due to arthritis and has failed non-surgical conservative treatments for greater than 12 weeks to include corticosteriod injections, supervised PT with diminished ADL's post treatment, use of assistive devices, and activity modification.  Onset of symptoms was gradual, starting 3 years ago with gradually worsening course since that time. The patient noted no past surgery on the left knee.  Patient currently rates pain at 8 out of 10 with activity. Patient has night pain, worsening of pain with activity and weight bearing, and pain that interferes with activities of daily living.  Patient has evidence of subchondral sclerosis, periarticular osteophytes, joint space narrowing, and medial femoral subluxation and lateral tibial subluxation  by imaging studies.  There is no active infection.  No past medical history on file. Past Surgical History:  Procedure Laterality Date   AORTIC VALVE REPLACEMENT     EXPLORATION POST OPERATIVE OPEN HEART     No Known Allergies Prior to Admission medications   Medication Sig Start Date End Date Taking? Authorizing Provider  memantine (NAMENDA) 5 MG tablet Take 1 tablet  (5 mg) twice a day 03/30/21   Rondel Jumbo, PA-C  metolazone (ZAROXOLYN) 2.5 MG tablet SMARTSIG:0.5 Tablet(s) By Mouth Once a Week 11/29/20   [provider]  potassium chloride (MICRO-K) 10 MEQ CR capsule Take by mouth. 08/19/18   [provider]  rosuvastatin (CRESTOR) 10 MG tablet Take 1 tablet by mouth daily. 08/29/19   [provider]  sulfamethoxazole-trimethoprim  (BACTRIM DS) 800-160 MG tablet Take 1 tablet by mouth 2 (two) times daily. 03/22/21   Suzan Slick, NP  telmisartan (MICARDIS) 40 MG tablet Take 40 mg by mouth daily. 11/29/20   [provider]  torsemide (DEMADEX) 10 MG tablet Take 10 mg by mouth daily. 1/2-1 tablet daily ( with extra potassium) PRN for leg swelling    [provider]   Social History   Socioeconomic History   Marital status: Widowed    Spouse name: Not on file   Number of children: Not on file   Years of education: Not on file   Highest education level: Not on file  Occupational History   Not on file  Tobacco Use   Smoking status: Never   Smokeless tobacco: Never  Vaping Use   Vaping Use: Never used  Substance and Sexual Activity   Alcohol use: Never   Drug use: Never   Sexual activity: Not on file  Other Topics Concern   Not on file  Social History Narrative   Right handed    Lives alone    Social Determinants of Health   Financial Resource Strain: Not on file  Food Insecurity: Not on file  Transportation Needs: Not on file  Physical Activity: Not on file  Stress: Not on file  Social Connections: Not on file   Family History  Problem Relation Age of Onset   Cancer Mother    Cancer Father    Stroke Father     ROS: Currently denies lightheadedness, dizziness, Fever, chills, CP, SOB.   No personal history of DVT, PE, MI, or CVA.  No loose teeth. Partial dentures.  All other systems have been reviewed and were otherwise currently negative with the exception of those mentioned in the HPI and as above.  Objective: Vitals: Ht: 5'2" Wt: 162.4 lbs Temp: 98.2 BP: 108/67 Pulse: 75 O2 100% on room air.   Physical Exam: General: Alert, NAD. Some confusion during recalling things. Antalgic Gait  HEENT: EOMI, Good Neck Extension  Pulm: No increased work of breathing.  Clear B/L A/P w/o crackle or wheeze.  CV: RRR, systolic click with S2 following carotid pulse. No g/r appreciated  GI:  soft, NT, ND. BS x 4 quadrants Neuro: CN II-XII grossly intact without focal deficit.  Sensation intact distally Skin: No lesions in the area of chief complaint. Resolving B/L LE lymphedema and cellulitis.  MSK/Surgical Site:  R knee + JLT. ROM 15-90.  Decreased strength in extension and flexion.  +EHL/FHL.  Moderate knee effusion. Pain with varus and valgus stress. NVI   Imaging Review Plain radiographs demonstrate severe degenerative joint disease of the bilateral knees. Left worse than right.   The overall alignment issignificant varus. The bone quality appears to be fair for age and reported activity level.  Preoperative templating of the joint replacement has been completed, documented, and submitted to the Operating Room personnel in order to optimize intra-operative equipment management.  Assessment: OA LEFT KNEE Active Problems:   * No active hospital problems. *   Plan: Plan for Procedure(s): TOTAL KNEE ARTHROPLASTY  The patient history, physical exam, clinical judgement of the provider and imaging are consistent with end stage degenerative joint disease and total joint arthroplasty is deemed medically necessary. The treatment options including medical management, injection therapy, and arthroplasty were discussed at length. The risks and benefits of Procedure(s): TOTAL KNEE ARTHROPLASTY were presented and reviewed.  The risks of nonoperative treatment, versus surgical intervention including but not limited to continued pain, aseptic loosening, stiffness, dislocation/subluxation, infection, bleeding, nerve injury, blood clots, cardiopulmonary complications, morbidity, mortality, among others were discussed. The patient verbalizes understanding and wishes to proceed with the plan.  Patient is being admitted for inpatient treatment for surgery, pain control, PT, prophylactic antibiotics, VTE prophylaxis, progressive ambulation, ADL's and discharge planning.   She will need to be  admitted to inpatient for several days to recover and work on her mobility with physical therapy, as well as pain control.  Due to the fact that she lives alone and also has a degree of some early signs of dementia, her family does not think it is best for her to go home after surgery, as they are also unable to take care of her at home. They would like her to go to a SNF for several weeks to recover from this surgery.   Dental prophylaxis discussed and recommended for 2 years postoperatively.  The patient does meet the criteria for TXA which will be used perioperatively.   Due to prior GI bleeding with ASA, Xarelto 10mg  daily will be used postoperatively for DVT prophylaxis in addition to SCDs, and early ambulation. Plan for Tylenol, Celebrex, oxycodone for pain. Should write the Oxy script for 1/2-1 tablet at a time. Zofran for nausea and vomiting. Colace for narcotic induced constipation Pharmacy- [facility] on [address] in [place]. Since going to SNF will likely need all scripts to be printed and signed. The patient is planning to be discharged to [facility] with IPP[facility]. Once she is discharged from there she will go home and into the care of her sister and medical  POA Ivin Booty who can be reached at [phone]. Follow up appt 05/12/21 at 4:15pm  Anticipated LOS equal to or greater than 2 midnights due to - Age 61 and older with one or more of the following:  - Obesity  - Expected need for hospital services (PT, OT, Nursing) required for safe  discharge  - Anticipated need for postoperative skilled nursing care or inpatient rehab  - Active co-morbidities: h/o CABG and aortic root and valve replacement 2019 OR   - Unanticipated findings during/Post Surgery: Slow post-op progression: GI, pain control, mobility  - Patient is a high risk of re-admission due to: Barriers to post-acute care (logistical, no family support in home)    Alisa Graff Office [phone] 04/04/2021 7:40  PM

## 2021-04-05 DIAGNOSIS — E785 Hyperlipidemia, unspecified: Secondary | ICD-10-CM | POA: Diagnosis not present

## 2021-04-05 DIAGNOSIS — I70213 Atherosclerosis of native arteries of extremities with intermittent claudication, bilateral legs: Secondary | ICD-10-CM | POA: Diagnosis not present

## 2021-04-05 DIAGNOSIS — I10 Essential (primary) hypertension: Secondary | ICD-10-CM | POA: Diagnosis not present

## 2021-04-05 DIAGNOSIS — I240 Acute coronary thrombosis not resulting in myocardial infarction: Secondary | ICD-10-CM | POA: Diagnosis not present

## 2021-04-05 DIAGNOSIS — I25119 Atherosclerotic heart disease of native coronary artery with unspecified angina pectoris: Secondary | ICD-10-CM | POA: Diagnosis not present

## 2021-04-05 DIAGNOSIS — I890 Lymphedema, not elsewhere classified: Secondary | ICD-10-CM | POA: Diagnosis not present

## 2021-04-08 DIAGNOSIS — I240 Acute coronary thrombosis not resulting in myocardial infarction: Secondary | ICD-10-CM | POA: Diagnosis not present

## 2021-04-08 DIAGNOSIS — I70213 Atherosclerosis of native arteries of extremities with intermittent claudication, bilateral legs: Secondary | ICD-10-CM | POA: Diagnosis not present

## 2021-04-08 DIAGNOSIS — E785 Hyperlipidemia, unspecified: Secondary | ICD-10-CM | POA: Diagnosis not present

## 2021-04-08 DIAGNOSIS — I890 Lymphedema, not elsewhere classified: Secondary | ICD-10-CM | POA: Diagnosis not present

## 2021-04-08 DIAGNOSIS — I10 Essential (primary) hypertension: Secondary | ICD-10-CM | POA: Diagnosis not present

## 2021-04-08 DIAGNOSIS — I25119 Atherosclerotic heart disease of native coronary artery with unspecified angina pectoris: Secondary | ICD-10-CM | POA: Diagnosis not present

## 2021-04-11 ENCOUNTER — Ambulatory Visit (INDEPENDENT_AMBULATORY_CARE_PROVIDER_SITE_OTHER): Payer: Medicare Other | Admitting: Orthopedic Surgery

## 2021-04-11 ENCOUNTER — Other Ambulatory Visit: Payer: Self-pay

## 2021-04-11 ENCOUNTER — Encounter: Payer: Self-pay | Admitting: Orthopedic Surgery

## 2021-04-11 DIAGNOSIS — I70213 Atherosclerosis of native arteries of extremities with intermittent claudication, bilateral legs: Secondary | ICD-10-CM | POA: Diagnosis not present

## 2021-04-11 DIAGNOSIS — I240 Acute coronary thrombosis not resulting in myocardial infarction: Secondary | ICD-10-CM | POA: Diagnosis not present

## 2021-04-11 DIAGNOSIS — I25119 Atherosclerotic heart disease of native coronary artery with unspecified angina pectoris: Secondary | ICD-10-CM | POA: Diagnosis not present

## 2021-04-11 DIAGNOSIS — I872 Venous insufficiency (chronic) (peripheral): Secondary | ICD-10-CM | POA: Diagnosis not present

## 2021-04-11 DIAGNOSIS — I890 Lymphedema, not elsewhere classified: Secondary | ICD-10-CM | POA: Diagnosis not present

## 2021-04-11 DIAGNOSIS — L03116 Cellulitis of left lower limb: Secondary | ICD-10-CM | POA: Diagnosis not present

## 2021-04-11 DIAGNOSIS — E785 Hyperlipidemia, unspecified: Secondary | ICD-10-CM | POA: Diagnosis not present

## 2021-04-11 DIAGNOSIS — I10 Essential (primary) hypertension: Secondary | ICD-10-CM | POA: Diagnosis not present

## 2021-04-11 NOTE — Progress Notes (Signed)
   Office Visit Note   Patient: Brandy Bowers           Date of Birth: 07/16/1950           MRN: [id number] Visit Date: 04/11/2021              Requested by: Emmaline Kluver, MD [address] [place],  [place] [address] PCP: Venetia Maxon, Sharon Mt, MD  Chief Complaint  Patient presents with   Left Leg - Pain    Redness and hot to touch      HPI: Patient is a 70 year old woman who has had a history of cellulitis with venous and lymphatic insufficiency.  Patient noticed some warmth in her left leg with increased redness.  Assessment & Plan: Visit Diagnoses:  1. Venous insufficiency (chronic) (peripheral)   2. Cellulitis of left leg   3. Lymphedema     Plan: This seems to be more from swelling there is no cellulitis.  Recommended extra-large compression socks elevation exercise and protein supplements.  Follow-Up Instructions: Return in about 4 weeks (around 05/09/2021).   Ortho Exam  Patient is alert, oriented, no adenopathy, well-dressed, normal affect, normal respiratory effort. Examination patient has good pulses bilaterally she has venous and lymphatic insufficiency both lower extremities with some brawny edema there is no cellulitis no drainage no open ulcers there is no tenderness to palpation there is pitting edema.  Her calves are 41 cm in circumference.  Imaging: No results found. No images are attached to the encounter.  Labs: No results found for: HGBA1C, ESRSEDRATE, CRP, LABURIC, REPTSTATUS, GRAMSTAIN, CULT, LABORGA   No results found for: ALBUMIN, PREALBUMIN, CBC  No results found for: MG No results found for: VD25OH  No results found for: PREALBUMIN No flowsheet data found.   There is no height or weight on file to calculate BMI.  Orders:  No orders of the defined types were placed in this encounter.  No orders of the defined types were placed in this encounter.    Procedures: No procedures performed  Clinical Data: No additional  findings.  ROS:  All other systems negative, except as noted in the HPI. Review of Systems  Objective: Vital Signs: There were no vitals taken for this visit.  Specialty Comments:  No specialty comments available.  PMFS History: There are no problems to display for this patient.  History reviewed. No pertinent past medical history.  Family History  Problem Relation Age of Onset   Cancer Mother    Cancer Father    Stroke Father     Past Surgical History:  Procedure Laterality Date   AORTIC VALVE REPLACEMENT     EXPLORATION POST OPERATIVE OPEN HEART     Social History   Occupational History   Not on file  Tobacco Use   Smoking status: Never   Smokeless tobacco: Never  Vaping Use   Vaping Use: Never used  Substance and Sexual Activity   Alcohol use: Never   Drug use: Never   Sexual activity: Not on file

## 2021-04-12 DIAGNOSIS — I240 Acute coronary thrombosis not resulting in myocardial infarction: Secondary | ICD-10-CM | POA: Diagnosis not present

## 2021-04-12 DIAGNOSIS — I70213 Atherosclerosis of native arteries of extremities with intermittent claudication, bilateral legs: Secondary | ICD-10-CM | POA: Diagnosis not present

## 2021-04-12 DIAGNOSIS — I10 Essential (primary) hypertension: Secondary | ICD-10-CM | POA: Diagnosis not present

## 2021-04-12 DIAGNOSIS — E785 Hyperlipidemia, unspecified: Secondary | ICD-10-CM | POA: Diagnosis not present

## 2021-04-12 DIAGNOSIS — I25119 Atherosclerotic heart disease of native coronary artery with unspecified angina pectoris: Secondary | ICD-10-CM | POA: Diagnosis not present

## 2021-04-12 DIAGNOSIS — I890 Lymphedema, not elsewhere classified: Secondary | ICD-10-CM | POA: Diagnosis not present

## 2021-04-13 DIAGNOSIS — I10 Essential (primary) hypertension: Secondary | ICD-10-CM | POA: Diagnosis not present

## 2021-04-13 DIAGNOSIS — I890 Lymphedema, not elsewhere classified: Secondary | ICD-10-CM | POA: Diagnosis not present

## 2021-04-13 DIAGNOSIS — I240 Acute coronary thrombosis not resulting in myocardial infarction: Secondary | ICD-10-CM | POA: Diagnosis not present

## 2021-04-13 DIAGNOSIS — E785 Hyperlipidemia, unspecified: Secondary | ICD-10-CM | POA: Diagnosis not present

## 2021-04-13 DIAGNOSIS — I25119 Atherosclerotic heart disease of native coronary artery with unspecified angina pectoris: Secondary | ICD-10-CM | POA: Diagnosis not present

## 2021-04-13 DIAGNOSIS — I70213 Atherosclerosis of native arteries of extremities with intermittent claudication, bilateral legs: Secondary | ICD-10-CM | POA: Diagnosis not present

## 2021-04-14 DIAGNOSIS — I240 Acute coronary thrombosis not resulting in myocardial infarction: Secondary | ICD-10-CM | POA: Diagnosis not present

## 2021-04-14 DIAGNOSIS — E785 Hyperlipidemia, unspecified: Secondary | ICD-10-CM | POA: Diagnosis not present

## 2021-04-14 DIAGNOSIS — I70213 Atherosclerosis of native arteries of extremities with intermittent claudication, bilateral legs: Secondary | ICD-10-CM | POA: Diagnosis not present

## 2021-04-14 DIAGNOSIS — I890 Lymphedema, not elsewhere classified: Secondary | ICD-10-CM | POA: Diagnosis not present

## 2021-04-14 DIAGNOSIS — I25119 Atherosclerotic heart disease of native coronary artery with unspecified angina pectoris: Secondary | ICD-10-CM | POA: Diagnosis not present

## 2021-04-14 DIAGNOSIS — I10 Essential (primary) hypertension: Secondary | ICD-10-CM | POA: Diagnosis not present

## 2021-04-15 NOTE — Progress Notes (Addendum)
COVID swab appointment: 04-25-21  COVID Vaccine Completed:  No Date COVID Vaccine completed: Has received booster: COVID vaccine manufacturer: [employer]   Date of COVID positive in last 90 days:  No  PCP - Christa See, MD (office note on chart) Cardiologist -  Mathis Bud, MD (notes [facility])  Cardiac clearance in note dated 03/08/21 by Dr. Beatrix Fetters ([facility]).  Also on chart  Medical clearance on chart from Dr. Venetia Maxon dated 11-2 -22  Chest x-ray - N/A EKG - 01-13-21 [facility].  Copy on chart Stress Test - greater than 5 years ECHO - 03-03-21 [facility] Cardiac Cath - greater than 2 years [facility] Pacemaker/ICD device last checked: Spinal Cord Stimulator:  Sleep Study - N/A CPAP -   Fasting Blood Sugar - N/A Checks Blood Sugar _____ times a day  Blood Thinner Instructions:  N/A Aspirin Instructions: Last Dose:  Activity level:  Unable to climb stairs due to knee pain and has no stairs at home.  Can perform some activities of daily living without stopping and without symptoms of chest pain or shortness of breath.  Patient does have a home health aid to assist with bathing and dressing and physical therapy.  Patient does live alone with family close by.    Anesthesia review:  S/P CABG and Aortic valve replacement, Afib, HTN, COPD, SOB with exertion  BP 99/49 on initial arrival and recheck 104/61.  Her sister stated that BP meds were recently reduced to half a tablet and she is monitoring and reporting numbers to PCP.  Patient denies shortness of breath, fever, cough and chest pain at PAT appointment   Patient verbalized understanding of instructions that were given to them at the PAT appointment. Patient was also instructed that they will need to review over the PAT instructions again at home before surgery.

## 2021-04-15 NOTE — Patient Instructions (Addendum)
DUE TO COVID-19 ONLY ONE VISITOR IS ALLOWED TO COME WITH YOU AND STAY IN THE WAITING ROOM ONLY DURING PRE OP AND PROCEDURE.   **NO VISITORS ARE ALLOWED IN THE SHORT STAY AREA OR RECOVERY ROOM!!**  IF YOU WILL BE ADMITTED INTO THE HOSPITAL YOU ARE ALLOWED ONLY TWO SUPPORT PEOPLE DURING VISITATION HOURS ONLY (7 AM -8PM)    Up to two visitors ages 48+ are allowed at one time in a patient's room.  The visitors may rotate out with other people throughout the day.  Additionally, up to two children between the ages of 42 and 53 are allowed and do not count toward the number of allowed visitors.  Children within this age range must be accompanied by an adult visitor.  One adult visitor may remain with the patient overnight and must be in the room by 8 PM.  COVID SWAB TESTING MUST BE COMPLETED ON:  Monday, 04-25-21, Between the hours of 8 and 3  **MUST PRESENT COMPLETED FORM AT TESTING SITE**    [place] (backside of the building)  You are not required to quarantine, however you are required to wear a well-fitted mask when you are out and around people not in your household.  Hand Hygiene often Do NOT share personal items Notify your provider if you are in close contact with someone who has COVID or you develop fever 100.4 or greater, new onset of sneezing, cough, sore throat, shortness of breath or body aches.       Your procedure is scheduled on:  Wednesday, 04-27-21   Report to [facility] Main  Entrance     Report to admitting at 12:15 PM   Call this number if you have problems the morning of surgery [phone]   Do not eat food :After Midnight.   May have liquids until 12:00 PM (noon)  day of surgery  CLEAR LIQUID DIET  Foods Allowed                                                                     Foods Excluded  Water, Black Coffee (no milk/no creamer) and tea, regular and decaf                              liquids that you cannot  Plain  Jell-O in any flavor  (No red)                         see through such as: Fruit ices (not with fruit pulp)                                 milk, soups, orange juice  Iced Popsicles (No red)                                    All solid food                             Apple  juices Sports drinks like Gatorade (No red) Lightly seasoned clear broth or consume(fat free) Sugar     Complete one Ensure drink the morning of surgery at  12:00 PM (noon)  the day of surgery.      The day of surgery:  Drink ONE (1) Pre-Surgery Clear Ensure the morning of surgery. Drink in one sitting. Do not sip.  This drink was given to you during your hospital  pre-op appointment visit. Nothing else to drink after completing the Pre-Surgery Clear Ensure          If you have questions, please contact your surgeon's office.     Oral Hygiene is also important to reduce your risk of infection.                                    Remember - BRUSH YOUR TEETH THE MORNING OF SURGERY WITH YOUR REGULAR TOOTHPASTE   Do NOT smoke after Midnight   Take these medicines the morning of surgery with A SIP OF WATER:  Memantine, Rosuvastatin, Bactrim DS         Stop all vitamins and herbal supplements a week before surgery             You may not have any metal on your body including hair pins, jewelry, and body piercing             Do not wear make-up, lotions, powders, perfumes or deodorant  Do not wear nail polish including gel and S&S, artificial/acrylic nails, or any other type of covering on natural nails including finger and toenails. If you have artificial nails, gel coating, etc. that needs to be removed by a nail salon please have this removed prior to surgery or surgery may need to be canceled/ delayed if the surgeon/ anesthesia feels like they are unable to be safely monitored.   Do not shave  48 hours prior to surgery.       Do not bring valuables to the hospital. [place].   Contacts, dentures or bridgework may not be worn into surgery.   Bring small overnight bag day of surgery.  Special Instructions: Bring a copy of your healthcare power of attorney and living will documents the day of surgery if you haven't scanned them in before.  Please read over the following fact sheets you were given: IF YOU HAVE QUESTIONS ABOUT YOUR PRE OP INSTRUCTIONS PLEASE CALL [facility] - Preparing for Surgery Before surgery, you can play an important role.  Because skin is not sterile, your skin needs to be as free of germs as possible.  You can reduce the number of germs on your skin by washing with CHG (chlorahexidine gluconate) soap before surgery.  CHG is an antiseptic cleaner which kills germs and bonds with the skin to continue killing germs even after washing. Please DO NOT use if you have an allergy to CHG or antibacterial soaps.  If your skin becomes reddened/irritated stop using the CHG and inform your nurse when you arrive at Short Stay. Do not shave (including legs and underarms) for at least 48 hours prior to the first CHG shower.  You may shave your face/neck.  Please follow these instructions carefully:  1.  Shower with CHG Soap the night before surgery and the  morning of surgery.  2.  If you choose to wash  your hair, wash your hair first as usual with your normal  shampoo.  3.  After you shampoo, rinse your hair and body thoroughly to remove the shampoo.                             4.  Use CHG as you would any other liquid soap.  You can apply chg directly to the skin and wash.  Gently with a scrungie or clean washcloth.  5.  Apply the CHG Soap to your body ONLY FROM THE NECK DOWN.   Do   not use on face/ open                           Wound or open sores. Avoid contact with eyes, ears mouth and   genitals (private parts).                       Wash face,  Genitals (private parts) with your normal soap.             6.  Wash  thoroughly, paying special attention to the area where your    surgery  will be performed.  7.  Thoroughly rinse your body with warm water from the neck down.  8.  DO NOT shower/wash with your normal soap after using and rinsing off the CHG Soap.                9.  Pat yourself dry with a clean towel.            10.  Wear clean pajamas.            11.  Place clean sheets on your bed the night of your first shower and do not  sleep with pets. Day of Surgery : Do not apply any lotions/deodorants the morning of surgery.  Please wear clean clothes to the hospital/surgery center.  FAILURE TO FOLLOW THESE INSTRUCTIONS MAY RESULT IN THE CANCELLATION OF YOUR SURGERY  PATIENT SIGNATURE_________________________________  NURSE SIGNATURE__________________________________  ________________________________________________________________________   Adam Phenix  An incentive spirometer is a tool that can help keep your lungs clear and active. This tool measures how well you are filling your lungs with each breath. Taking long deep breaths may help reverse or decrease the chance of developing breathing (pulmonary) problems (especially infection) following: A long period of time when you are unable to move or be active. BEFORE THE PROCEDURE  If the spirometer includes an indicator to show your best effort, your nurse or respiratory therapist will set it to a desired goal. If possible, sit up straight or lean slightly forward. Try not to slouch. Hold the incentive spirometer in an upright position. INSTRUCTIONS FOR USE  Sit on the edge of your bed if possible, or sit up as far as you can in bed or on a chair. Hold the incentive spirometer in an upright position. Breathe out normally. Place the mouthpiece in your mouth and seal your lips tightly around it. Breathe in slowly and as deeply as possible, raising the piston or the ball toward the top of the column. Hold your breath for 3-5 seconds or  for as long as possible. Allow the piston or ball to fall to the bottom of the column. Remove the mouthpiece from your mouth and breathe out normally. Rest for a few seconds and repeat Steps 1 through 7 at  least 10 times every 1-2 hours when you are awake. Take your time and take a few normal breaths between deep breaths. The spirometer may include an indicator to show your best effort. Use the indicator as a goal to work toward during each repetition. After each set of 10 deep breaths, practice coughing to be sure your lungs are clear. If you have an incision (the cut made at the time of surgery), support your incision when coughing by placing a pillow or rolled up towels firmly against it. Once you are able to get out of bed, walk around indoors and cough well. You may stop using the incentive spirometer when instructed by your caregiver.  RISKS AND COMPLICATIONS Take your time so you do not get dizzy or light-headed. If you are in pain, you may need to take or ask for pain medication before doing incentive spirometry. It is harder to take a deep breath if you are having pain. AFTER USE Rest and breathe slowly and easily. It can be helpful to keep track of a log of your progress. Your caregiver can provide you with a simple table to help with this. If you are using the spirometer at home, follow these instructions: [place] IF:  You are having difficultly using the spirometer. You have trouble using the spirometer as often as instructed. Your pain medication is not giving enough relief while using the spirometer. You develop fever of 100.5 F (38.1 C) or higher. SEEK IMMEDIATE MEDICAL CARE IF:  You cough up bloody sputum that had not been present before. You develop fever of 102 F (38.9 C) or greater. You develop worsening pain at or near the incision site. MAKE SURE YOU:  Understand these instructions. Will watch your condition. Will get help right away if you are not doing  well or get worse. Document Released: 09/11/2006 Document Revised: 07/24/2011 Document Reviewed: 11/12/2006 [facility] Patient Information 2014 [place], [place].   ________________________________________________________________________

## 2021-04-18 DIAGNOSIS — I25119 Atherosclerotic heart disease of native coronary artery with unspecified angina pectoris: Secondary | ICD-10-CM | POA: Diagnosis not present

## 2021-04-18 DIAGNOSIS — I890 Lymphedema, not elsewhere classified: Secondary | ICD-10-CM | POA: Diagnosis not present

## 2021-04-18 DIAGNOSIS — I10 Essential (primary) hypertension: Secondary | ICD-10-CM | POA: Diagnosis not present

## 2021-04-18 DIAGNOSIS — E785 Hyperlipidemia, unspecified: Secondary | ICD-10-CM | POA: Diagnosis not present

## 2021-04-18 DIAGNOSIS — I70213 Atherosclerosis of native arteries of extremities with intermittent claudication, bilateral legs: Secondary | ICD-10-CM | POA: Diagnosis not present

## 2021-04-18 DIAGNOSIS — I240 Acute coronary thrombosis not resulting in myocardial infarction: Secondary | ICD-10-CM | POA: Diagnosis not present

## 2021-04-19 ENCOUNTER — Other Ambulatory Visit: Payer: Self-pay

## 2021-04-19 ENCOUNTER — Encounter (HOSPITAL_COMMUNITY)
Admission: RE | Admit: 2021-04-19 | Discharge: 2021-04-19 | Disposition: A | Discharge status: Home / Self Care | Payer: Medicare Other | Source: Ambulatory Visit | Attending: Orthopedic Surgery | Admitting: Orthopedic Surgery

## 2021-04-19 ENCOUNTER — Encounter (HOSPITAL_COMMUNITY): Payer: Self-pay

## 2021-04-19 VITALS — BP 104/61 | HR 77 | Temp 98.7°F | Resp 18 | Ht 64.0 in | Wt 156.0 lb

## 2021-04-19 DIAGNOSIS — Z01812 Encounter for preprocedural laboratory examination: Secondary | ICD-10-CM | POA: Insufficient documentation

## 2021-04-19 DIAGNOSIS — M1712 Unilateral primary osteoarthritis, left knee: Secondary | ICD-10-CM | POA: Insufficient documentation

## 2021-04-19 DIAGNOSIS — Z951 Presence of aortocoronary bypass graft: Secondary | ICD-10-CM | POA: Insufficient documentation

## 2021-04-19 DIAGNOSIS — I10 Essential (primary) hypertension: Secondary | ICD-10-CM | POA: Diagnosis not present

## 2021-04-19 DIAGNOSIS — I2510 Atherosclerotic heart disease of native coronary artery without angina pectoris: Secondary | ICD-10-CM | POA: Diagnosis not present

## 2021-04-19 DIAGNOSIS — Z01818 Encounter for other preprocedural examination: Secondary | ICD-10-CM

## 2021-04-19 DIAGNOSIS — F0390 Unspecified dementia without behavioral disturbance: Secondary | ICD-10-CM | POA: Diagnosis not present

## 2021-04-19 DIAGNOSIS — I890 Lymphedema, not elsewhere classified: Secondary | ICD-10-CM | POA: Diagnosis not present

## 2021-04-19 HISTORY — DX: Other specified postprocedural states: Z98.890

## 2021-04-19 HISTORY — DX: Unspecified dementia, unspecified severity, without behavioral disturbance, psychotic disturbance, mood disturbance, and anxiety: F03.90

## 2021-04-19 HISTORY — DX: Unspecified osteoarthritis, unspecified site: M19.90

## 2021-04-19 HISTORY — DX: Essential (primary) hypertension: I10

## 2021-04-19 HISTORY — DX: Nausea with vomiting, unspecified: R11.2

## 2021-04-19 HISTORY — DX: Hyperlipidemia, unspecified: E78.5

## 2021-04-19 LAB — BASIC METABOLIC PANEL
Anion gap: 10 (ref 5–15)
BUN: 22 mg/dL (ref 8–23)
CO2: 28 mmol/L (ref 22–32)
Calcium: 9.1 mg/dL (ref 8.9–10.3)
Chloride: 94 mmol/L — ABNORMAL LOW (ref 98–111)
Creatinine, Ser: 0.87 mg/dL (ref 0.44–1.00)
GFR, Estimated: >60 mL/min (ref >60)
Glucose, Bld: 86 mg/dL (ref 70–99)
Potassium: 3.8 mmol/L (ref 3.5–5.1)
Sodium: 132 mmol/L — ABNORMAL LOW (ref 135–145)

## 2021-04-19 LAB — CBC
HCT: 33.3 % — ABNORMAL LOW (ref 36.0–46.0)
Hemoglobin: 11 g/dL — ABNORMAL LOW (ref 12.0–15.0)
MCH: 31.1 pg (ref 26.0–34.0)
MCHC: 33 g/dL (ref 30.0–36.0)
MCV: 94.1 fL (ref 80.0–100.0)
Platelets: 339 10*3/uL (ref 150–400)
RBC: 3.54 MIL/uL — ABNORMAL LOW (ref 3.87–5.11)
RDW: 13.6 % (ref 11.5–15.5)
WBC: 8.8 10*3/uL (ref 4.0–10.5)
nRBC: 0 % (ref 0.0–0.2)

## 2021-04-19 LAB — SURGICAL PCR SCREEN
MRSA, PCR: NEGATIVE
Staphylococcus aureus: NEGATIVE

## 2021-04-19 NOTE — Care Plan (Signed)
Ortho Bundle Case Management Note  Patient Details  Name: Brandy Bowers MRN: [id number] Date of Birth: December 25, 1950    Spoke with patient and family prior to surgery. She doesn't have 24 hour care for after surgery and will need to go to SNF. Arrangements have been made for her to go to [facility] for ST rehab. She has been there before. Olivia Mackie - admissions - [phone] has been conctacted.  All DME needs will be assessed prior to discharge from SNF.  Patient and MD in agreement with plan. Choice offered                  DME Arranged:    DME Agency:     HH Arranged:    HH Agency:     Additional Comments: Please contact me with any questions of if this plan should need to change.  Ladell Heads,  [place] Specialist  [phone] 04/19/2021, 11:41 AM

## 2021-04-20 DIAGNOSIS — I10 Essential (primary) hypertension: Secondary | ICD-10-CM | POA: Diagnosis not present

## 2021-04-20 DIAGNOSIS — I25119 Atherosclerotic heart disease of native coronary artery with unspecified angina pectoris: Secondary | ICD-10-CM | POA: Diagnosis not present

## 2021-04-20 DIAGNOSIS — I240 Acute coronary thrombosis not resulting in myocardial infarction: Secondary | ICD-10-CM | POA: Diagnosis not present

## 2021-04-20 DIAGNOSIS — E785 Hyperlipidemia, unspecified: Secondary | ICD-10-CM | POA: Diagnosis not present

## 2021-04-20 DIAGNOSIS — I890 Lymphedema, not elsewhere classified: Secondary | ICD-10-CM | POA: Diagnosis not present

## 2021-04-20 DIAGNOSIS — I70213 Atherosclerosis of native arteries of extremities with intermittent claudication, bilateral legs: Secondary | ICD-10-CM | POA: Diagnosis not present

## 2021-04-20 NOTE — Anesthesia Preprocedure Evaluation (Addendum)
Anesthesia Evaluation  Patient identified by MRN, date of birth, ID band Patient awake    Reviewed: Allergy & Precautions, NPO status , Patient's Chart, lab work & pertinent test results  History of Anesthesia Complications (+) PONV and history of anesthetic complications  Airway Mallampati: III  TM Distance: >3 FB Neck ROM: Full    Dental  (+) Dental Advisory Given   Pulmonary neg pulmonary ROS,    Pulmonary exam normal        Cardiovascular hypertension, Pt. on medications Normal cardiovascular exam+ Valvular Problems/Murmurs    TTE 03/03/21: Moderate left ventricular hypertrophy. EF 65-70%. Moderate diastolic dysfunction (pseudonormal pattern) with elevated left atrial pressure. S/P Medtronic Freestyle (2019) AVR. Mild MR and mild-mod MS. Mild TR. Mild pulmonary hypertension.      Neuro/Psych PSYCHIATRIC DISORDERS Dementia negative neurological ROS     GI/Hepatic negative GI ROS, Neg liver ROS,   Endo/Other  negative endocrine ROS  Renal/GU negative Renal ROS     Musculoskeletal  (+) Arthritis ,   Abdominal   Peds  Hematology negative hematology ROS (+)   Anesthesia Other Findings   Reproductive/Obstetrics                          Anesthesia Physical Anesthesia Plan  ASA: 3  Anesthesia Plan: Spinal   Post-op Pain Management: Regional block   Induction:   PONV Risk Score and Plan: 3 and Treatment may vary due to age or medical condition and Propofol infusion  Airway Management Planned: Natural Airway and Simple Face Mask  Additional Equipment: None  Intra-op Plan:   Post-operative Plan:   Informed Consent: I have reviewed the patients History and Physical, chart, labs and discussed the procedure including the risks, benefits and alternatives for the proposed anesthesia with the patient or authorized representative who has indicated his/her understanding and acceptance.        Plan Discussed with: CRNA and Anesthesiologist  Anesthesia Plan Comments: (Labs reviewed, platelets acceptable. Discussed risks and benefits of spinal, including spinal/epidural hematoma, infection, failed block, and PDPH. Patient expressed understanding and wished to proceed. )    Anesthesia Quick Evaluation

## 2021-04-20 NOTE — Progress Notes (Signed)
Anesthesia Chart Review   Case: [id number] Date/Time: 04/27/21 1450   Procedure: TOTAL KNEE ARTHROPLASTY (Left: Knee)   Anesthesia type: Choice   Pre-op diagnosis: OA LEFT KNEE   Location: [facility] ROOM 09 / [facility]   Surgeons: Willaim Sheng, MD       DISCUSSION:70 y.o. never smoker with h/o PONV, HTN, CAD, s/p CABG and AVR 12/2017, dementia, left knee OA scheduled for above procedure 04/27/2021 with Dr. Charlies Constable.   Pt seen by cardiology 03/08/2021 for preoperative evaluation. Per OV note, "Patient should be considered cleared for the intended surgery from a cardiac perspective."  Clearance received from PCP as well, on chart.   Anticipate pt can proceed with planned procedure barring acute status change.   VS: BP 104/61   Pulse 77   Temp 37.1 C (Oral)   Resp 18   Ht 5\' 4"  (1.626 m)   Wt 70.8 kg   SpO2 100%   BMI 26.78 kg/m   PROVIDERS: Street, Sharon Mt, MD is PCP  McGukin, Laban Emperor, MD is Cardiologist  LABS: Labs reviewed: Acceptable for surgery. (all labs ordered are listed, but only abnormal results are displayed)  Labs Reviewed  CBC - Abnormal; Notable for the following components:      Result Value   RBC 3.54 (*)    Hemoglobin 11.0 (*)    HCT 33.3 (*)    All other components within normal limits  BASIC METABOLIC PANEL - Abnormal; Notable for the following components:   Sodium 132 (*)    Chloride 94 (*)    All other components within normal limits  SURGICAL PCR SCREEN     IMAGES:   EKG: Tracing on chart  CV: Echo 03/03/2021 SUMMARY  Moderate left ventricular hypertrophy  Left ventricular systolic function is normal.  S/P Medtronic Freestyle (2019) AVR  Aortic valve mean pressure gradient is 8 mmHg.  There is severe mitral annular calcification.  There is mild mitral regurgitation.  There is mild to moderate mitral stenosis.  The mean gradient across the mitral valve is 7 mmHg.  There is mild tricuspid regurgitation.  Estimated  right ventricular systolic pressure is 38 mmHg.  Mild pulmonary hypertension.  Probably no significant change in comparison with the prior study  noted  Past Medical History:  Diagnosis Date   Arthritis    Dementia ([place])    Early staages of dementia   Hyperlipemia    Hypertension    PONV (postoperative nausea and vomiting)     Past Surgical History:  Procedure Laterality Date   AORTIC VALVE REPLACEMENT     EXPLORATION POST OPERATIVE OPEN HEART     FINGER SURGERY     THYROID SURGERY     TONSILLECTOMY      MEDICATIONS:  memantine (NAMENDA) 5 MG tablet   metolazone (ZAROXOLYN) 2.5 MG tablet   potassium chloride (MICRO-K) 10 MEQ CR capsule   rosuvastatin (CRESTOR) 10 MG tablet   sulfamethoxazole-trimethoprim (BACTRIM DS) 800-160 MG tablet   telmisartan (MICARDIS) 40 MG tablet   torsemide (DEMADEX) 10 MG tablet   No current facility-administered medications for this encounter.     Konrad Felix Ward, PA-C [facility] Pre-Surgical Testing [phone]

## 2021-04-21 DIAGNOSIS — I952 Hypotension due to drugs: Secondary | ICD-10-CM | POA: Diagnosis not present

## 2021-04-21 DIAGNOSIS — Z6830 Body mass index (BMI) 30.0-30.9, adult: Secondary | ICD-10-CM | POA: Diagnosis not present

## 2021-04-21 DIAGNOSIS — R8290 Unspecified abnormal findings in urine: Secondary | ICD-10-CM | POA: Diagnosis not present

## 2021-04-22 DIAGNOSIS — I70213 Atherosclerosis of native arteries of extremities with intermittent claudication, bilateral legs: Secondary | ICD-10-CM | POA: Diagnosis not present

## 2021-04-22 DIAGNOSIS — I240 Acute coronary thrombosis not resulting in myocardial infarction: Secondary | ICD-10-CM | POA: Diagnosis not present

## 2021-04-22 DIAGNOSIS — I890 Lymphedema, not elsewhere classified: Secondary | ICD-10-CM | POA: Diagnosis not present

## 2021-04-22 DIAGNOSIS — E785 Hyperlipidemia, unspecified: Secondary | ICD-10-CM | POA: Diagnosis not present

## 2021-04-22 DIAGNOSIS — I25119 Atherosclerotic heart disease of native coronary artery with unspecified angina pectoris: Secondary | ICD-10-CM | POA: Diagnosis not present

## 2021-04-22 DIAGNOSIS — I10 Essential (primary) hypertension: Secondary | ICD-10-CM | POA: Diagnosis not present

## 2021-04-25 ENCOUNTER — Other Ambulatory Visit: Payer: Self-pay | Admitting: Orthopedic Surgery

## 2021-04-25 DIAGNOSIS — E785 Hyperlipidemia, unspecified: Secondary | ICD-10-CM | POA: Diagnosis not present

## 2021-04-25 DIAGNOSIS — I70213 Atherosclerosis of native arteries of extremities with intermittent claudication, bilateral legs: Secondary | ICD-10-CM | POA: Diagnosis not present

## 2021-04-25 DIAGNOSIS — I240 Acute coronary thrombosis not resulting in myocardial infarction: Secondary | ICD-10-CM | POA: Diagnosis not present

## 2021-04-25 DIAGNOSIS — I25119 Atherosclerotic heart disease of native coronary artery with unspecified angina pectoris: Secondary | ICD-10-CM | POA: Diagnosis not present

## 2021-04-25 DIAGNOSIS — I890 Lymphedema, not elsewhere classified: Secondary | ICD-10-CM | POA: Diagnosis not present

## 2021-04-25 DIAGNOSIS — I10 Essential (primary) hypertension: Secondary | ICD-10-CM | POA: Diagnosis not present

## 2021-04-26 LAB — SARS CORONAVIRUS 2 (TAT 6-24 HRS): SARS Coronavirus 2: POSITIVE — AB

## 2021-04-26 NOTE — Progress Notes (Signed)
1025 Pt is Positive for COVID per [facility] Pathology. Results relayed to [facility] at [facility] short stay.

## 2021-05-11 ENCOUNTER — Encounter (HOSPITAL_COMMUNITY): Payer: Self-pay | Admitting: Orthopedic Surgery

## 2021-05-11 NOTE — Progress Notes (Signed)
DUE TO COVID-19 ONLY ONE VISITOR IS ALLOWED TO COME WITH YOU AND STAY IN THE WAITING ROOM ONLY DURING PRE OP AND PROCEDURE DAY OF SURGERY.   Two VISITORS MAY VISIT WITH YOU AFTER SURGERY IN YOUR PRIVATE ROOM DURING VISITING HOURS ONLY!  PCP - Dr Christa See Cardiologist - Dr Jeneen Rinks McGukin Neurology - Sharene Butters, PA-C  Chest x-ray - n/a EKG - 01/13/21 CE - Requested Tracing Stress Test - 04/22/20 [facility] ECHO - 03/03/21 [facility] Cardiac Cath - n/a  ICD Pacemaker/Loop - n/a  Sleep Study -  n/a CPAP - none  ERAS: Clear liquids til 6 AM DOS  Anesthesia review: Yes  STOP now taking any Aspirin (unless otherwise instructed by your surgeon), Aleve, Naproxen, Ibuprofen, Motrin, Advil, Goody's, BC's, all herbal medications, fish oil, and all vitamins.   Coronavirus Screening Covid test was positive on 04/25/21 Per Sister Ivin Booty - patient does not have any of the following symptoms:  Cough yes/no: No Fever (>100.68F)  yes/no: No Runny nose yes/no: No Sore throat yes/no: No Difficulty breathing/shortness of breath  yes/no: No  Has patient traveled in the last 14 days and where? yes/no: No  Spoke with Concha Pyo [phone] for PAT information and instructions for DOS.  Ivin Booty verbalized understanding of instructions that were given via phone.

## 2021-05-12 ENCOUNTER — Encounter (HOSPITAL_COMMUNITY): Payer: Self-pay | Admitting: Orthopedic Surgery

## 2021-05-13 ENCOUNTER — Inpatient Hospital Stay (HOSPITAL_COMMUNITY): Payer: Medicare Other | Admitting: Physician Assistant

## 2021-05-13 ENCOUNTER — Inpatient Hospital Stay (HOSPITAL_COMMUNITY): Payer: Medicare Other

## 2021-05-13 ENCOUNTER — Encounter (HOSPITAL_COMMUNITY): Payer: Self-pay | Admitting: Orthopedic Surgery

## 2021-05-13 ENCOUNTER — Other Ambulatory Visit: Payer: Self-pay

## 2021-05-13 ENCOUNTER — Encounter (HOSPITAL_COMMUNITY)
Admission: RE | Disposition: A | Discharge status: Home / Self Care | Payer: Self-pay | Source: Home / Self Care | Attending: Orthopedic Surgery

## 2021-05-13 ENCOUNTER — Inpatient Hospital Stay (HOSPITAL_COMMUNITY): Payer: Medicare Other | Admitting: Vascular Surgery

## 2021-05-13 ENCOUNTER — Inpatient Hospital Stay (HOSPITAL_COMMUNITY)
Admission: RE | Admit: 2021-05-13 | Discharge: 2021-05-16 | DRG: 470 | Disposition: A | Discharge status: Skilled Nursing Facility | Payer: Medicare Other | Attending: Orthopedic Surgery | Admitting: Orthopedic Surgery

## 2021-05-13 DIAGNOSIS — J449 Chronic obstructive pulmonary disease, unspecified: Secondary | ICD-10-CM | POA: Diagnosis not present

## 2021-05-13 DIAGNOSIS — D649 Anemia, unspecified: Secondary | ICD-10-CM | POA: Diagnosis not present

## 2021-05-13 DIAGNOSIS — R2689 Other abnormalities of gait and mobility: Secondary | ICD-10-CM | POA: Diagnosis not present

## 2021-05-13 DIAGNOSIS — N179 Acute kidney failure, unspecified: Secondary | ICD-10-CM | POA: Diagnosis not present

## 2021-05-13 DIAGNOSIS — M1712 Unilateral primary osteoarthritis, left knee: Secondary | ICD-10-CM | POA: Diagnosis not present

## 2021-05-13 DIAGNOSIS — Z8616 Personal history of COVID-19: Secondary | ICD-10-CM

## 2021-05-13 DIAGNOSIS — E785 Hyperlipidemia, unspecified: Secondary | ICD-10-CM | POA: Diagnosis present

## 2021-05-13 DIAGNOSIS — R0602 Shortness of breath: Secondary | ICD-10-CM | POA: Diagnosis not present

## 2021-05-13 DIAGNOSIS — C73 Malignant neoplasm of thyroid gland: Secondary | ICD-10-CM | POA: Diagnosis not present

## 2021-05-13 DIAGNOSIS — Z7401 Bed confinement status: Secondary | ICD-10-CM | POA: Diagnosis not present

## 2021-05-13 DIAGNOSIS — I10 Essential (primary) hypertension: Secondary | ICD-10-CM | POA: Diagnosis not present

## 2021-05-13 DIAGNOSIS — M21162 Varus deformity, not elsewhere classified, left knee: Secondary | ICD-10-CM | POA: Diagnosis not present

## 2021-05-13 DIAGNOSIS — G8918 Other acute postprocedural pain: Secondary | ICD-10-CM | POA: Diagnosis not present

## 2021-05-13 DIAGNOSIS — Z741 Need for assistance with personal care: Secondary | ICD-10-CM | POA: Diagnosis not present

## 2021-05-13 DIAGNOSIS — R2681 Unsteadiness on feet: Secondary | ICD-10-CM | POA: Diagnosis not present

## 2021-05-13 DIAGNOSIS — M6281 Muscle weakness (generalized): Secondary | ICD-10-CM | POA: Diagnosis not present

## 2021-05-13 DIAGNOSIS — T8172XD Complication of vein following a procedure, not elsewhere classified, subsequent encounter: Secondary | ICD-10-CM | POA: Diagnosis not present

## 2021-05-13 DIAGNOSIS — E079 Disorder of thyroid, unspecified: Secondary | ICD-10-CM | POA: Diagnosis not present

## 2021-05-13 DIAGNOSIS — Z952 Presence of prosthetic heart valve: Secondary | ICD-10-CM

## 2021-05-13 DIAGNOSIS — F0390 Unspecified dementia without behavioral disturbance: Secondary | ICD-10-CM | POA: Diagnosis not present

## 2021-05-13 DIAGNOSIS — R41841 Cognitive communication deficit: Secondary | ICD-10-CM | POA: Diagnosis not present

## 2021-05-13 DIAGNOSIS — Z951 Presence of aortocoronary bypass graft: Secondary | ICD-10-CM | POA: Diagnosis not present

## 2021-05-13 DIAGNOSIS — I2510 Atherosclerotic heart disease of native coronary artery without angina pectoris: Secondary | ICD-10-CM | POA: Diagnosis not present

## 2021-05-13 DIAGNOSIS — Z880 Allergy status to penicillin: Secondary | ICD-10-CM | POA: Diagnosis not present

## 2021-05-13 DIAGNOSIS — R4182 Altered mental status, unspecified: Secondary | ICD-10-CM | POA: Diagnosis not present

## 2021-05-13 DIAGNOSIS — Z471 Aftercare following joint replacement surgery: Secondary | ICD-10-CM | POA: Diagnosis not present

## 2021-05-13 DIAGNOSIS — I350 Nonrheumatic aortic (valve) stenosis: Secondary | ICD-10-CM | POA: Diagnosis not present

## 2021-05-13 DIAGNOSIS — R279 Unspecified lack of coordination: Secondary | ICD-10-CM | POA: Diagnosis not present

## 2021-05-13 DIAGNOSIS — M25562 Pain in left knee: Secondary | ICD-10-CM | POA: Diagnosis not present

## 2021-05-13 DIAGNOSIS — Z9889 Other specified postprocedural states: Secondary | ICD-10-CM

## 2021-05-13 DIAGNOSIS — I4891 Unspecified atrial fibrillation: Secondary | ICD-10-CM | POA: Diagnosis not present

## 2021-05-13 DIAGNOSIS — Z48812 Encounter for surgical aftercare following surgery on the circulatory system: Secondary | ICD-10-CM | POA: Diagnosis not present

## 2021-05-13 DIAGNOSIS — M810 Age-related osteoporosis without current pathological fracture: Secondary | ICD-10-CM | POA: Diagnosis not present

## 2021-05-13 HISTORY — PX: TOTAL KNEE ARTHROPLASTY: SHX125

## 2021-05-13 HISTORY — DX: Unilateral primary osteoarthritis, left knee: M17.12

## 2021-05-13 SURGERY — ARTHROPLASTY, KNEE, TOTAL
Anesthesia: Spinal | Site: Knee | Laterality: Left

## 2021-05-13 MED ORDER — SODIUM CHLORIDE 0.9 % IR SOLN
Status: DC | PRN
  Administered 2021-05-13: 3000 mL

## 2021-05-13 MED ORDER — KETOROLAC TROMETHAMINE 30 MG/ML IJ SOLN
INTRAMUSCULAR | Status: DC | PRN
  Administered 2021-05-13: 30 mg via INTRAVENOUS

## 2021-05-13 MED ORDER — ORAL CARE MOUTH RINSE: 15.0000 mL | Freq: Once | OROMUCOSAL | Status: AC

## 2021-05-13 MED ORDER — OXYCODONE HCL 5 MG/5ML PO SOLN: 5.0000 mg | Freq: Once | ORAL | Status: DC | PRN

## 2021-05-13 MED ORDER — CHLORHEXIDINE GLUCONATE 0.12 % MT SOLN
15.0000 mL | Freq: Once | OROMUCOSAL | Status: AC
  Administered 2021-05-13: 07:00:00 15 mL via OROMUCOSAL
  Filled 2021-05-13: qty 15

## 2021-05-13 MED ORDER — ONDANSETRON HCL 4 MG/2ML IJ SOLN: 4.0000 mg | Freq: Four times a day (QID) | INTRAMUSCULAR | Status: DC | PRN

## 2021-05-13 MED ORDER — TRANEXAMIC ACID-NACL 1000-0.7 MG/100ML-% IV SOLN
1000.0000 mg | INTRAVENOUS | Status: AC
  Administered 2021-05-13: 09:00:00 1000 mg via INTRAVENOUS
  Filled 2021-05-13: qty 100

## 2021-05-13 MED ORDER — CIPROFLOXACIN IN D5W 400 MG/200ML IV SOLN
400.0000 mg | Freq: Once | INTRAVENOUS | Status: AC
  Administered 2021-05-13: 09:00:00 400 mg via INTRAVENOUS

## 2021-05-13 MED ORDER — ONDANSETRON HCL 4 MG/2ML IJ SOLN: 4.0000 mg | Freq: Once | INTRAMUSCULAR | Status: DC | PRN

## 2021-05-13 MED ORDER — CELECOXIB 200 MG PO CAPS
400.0000 mg | ORAL_CAPSULE | Freq: Once | ORAL | Status: AC
  Administered 2021-05-13: 07:00:00 400 mg via ORAL
  Filled 2021-05-13: qty 2

## 2021-05-13 MED ORDER — BUPIVACAINE LIPOSOME 1.3 % IJ SUSP
10.0000 mL | Freq: Once | INTRAMUSCULAR | Status: DC
  Filled 2021-05-13: qty 10

## 2021-05-13 MED ORDER — MENTHOL 3 MG MT LOZG: 1.0000 | LOZENGE | OROMUCOSAL | Status: DC | PRN

## 2021-05-13 MED ORDER — PROPOFOL 10 MG/ML IV BOLUS
INTRAVENOUS | Status: DC | PRN
  Administered 2021-05-13: 20 mg via INTRAVENOUS

## 2021-05-13 MED ORDER — METOCLOPRAMIDE HCL 5 MG PO TABS: 5.0000 mg | ORAL_TABLET | Freq: Three times a day (TID) | ORAL | Status: DC | PRN

## 2021-05-13 MED ORDER — IRBESARTAN 75 MG PO TABS
37.5000 mg | ORAL_TABLET | Freq: Every day | ORAL | Status: DC
  Administered 2021-05-14 – 2021-05-16 (×3): 37.5 mg via ORAL
  Filled 2021-05-13 (×3): qty 1

## 2021-05-13 MED ORDER — VANCOMYCIN HCL IN DEXTROSE 1-5 GM/200ML-% IV SOLN
1000.0000 mg | Freq: Two times a day (BID) | INTRAVENOUS | Status: AC
  Administered 2021-05-13: 20:00:00 1000 mg via INTRAVENOUS
  Filled 2021-05-13: qty 200

## 2021-05-13 MED ORDER — ACETAMINOPHEN 325 MG PO TABS
325.0000 mg | ORAL_TABLET | Freq: Four times a day (QID) | ORAL | Status: DC | PRN
  Administered 2021-05-15: 650 mg via ORAL
  Filled 2021-05-13: qty 2

## 2021-05-13 MED ORDER — MEMANTINE HCL 5 MG PO TABS
5.0000 mg | ORAL_TABLET | Freq: Two times a day (BID) | ORAL | Status: DC
  Administered 2021-05-13 – 2021-05-16 (×6): 5 mg via ORAL
  Filled 2021-05-13 (×7): qty 1

## 2021-05-13 MED ORDER — BUPIVACAINE LIPOSOME 1.3 % IJ SUSP
INTRAMUSCULAR | Status: AC
  Filled 2021-05-13: qty 20

## 2021-05-13 MED ORDER — BUPIVACAINE IN DEXTROSE 0.75-8.25 % IT SOLN
INTRATHECAL | Status: DC | PRN
  Administered 2021-05-13: 2 mL via INTRATHECAL

## 2021-05-13 MED ORDER — ONDANSETRON HCL 4 MG/2ML IJ SOLN
INTRAMUSCULAR | Status: AC
  Filled 2021-05-13: qty 2

## 2021-05-13 MED ORDER — PHENOL 1.4 % MT LIQD: 1.0000 | OROMUCOSAL | Status: DC | PRN

## 2021-05-13 MED ORDER — ACETAMINOPHEN 500 MG PO TABS
1000.0000 mg | ORAL_TABLET | Freq: Once | ORAL | Status: AC
  Administered 2021-05-13: 07:00:00 1000 mg via ORAL
  Filled 2021-05-13: qty 2

## 2021-05-13 MED ORDER — FENTANYL CITRATE (PF) 100 MCG/2ML IJ SOLN: 25.0000 ug | INTRAMUSCULAR | Status: DC | PRN

## 2021-05-13 MED ORDER — PHENYLEPHRINE HCL-NACL 20-0.9 MG/250ML-% IV SOLN
INTRAVENOUS | Status: DC | PRN
  Administered 2021-05-13: 25 ug/min via INTRAVENOUS

## 2021-05-13 MED ORDER — PROPOFOL 10 MG/ML IV BOLUS
INTRAVENOUS | Status: AC
  Filled 2021-05-13: qty 20

## 2021-05-13 MED ORDER — CIPROFLOXACIN IN D5W 400 MG/200ML IV SOLN
400.0000 mg | Freq: Two times a day (BID) | INTRAVENOUS | Status: AC
  Administered 2021-05-13: 20:00:00 400 mg via INTRAVENOUS
  Filled 2021-05-13: qty 200

## 2021-05-13 MED ORDER — ONDANSETRON HCL 4 MG PO TABS: 4.0000 mg | ORAL_TABLET | Freq: Four times a day (QID) | ORAL | Status: DC | PRN

## 2021-05-13 MED ORDER — LIDOCAINE 2% (20 MG/ML) 5 ML SYRINGE
INTRAMUSCULAR | Status: AC
  Filled 2021-05-13: qty 5

## 2021-05-13 MED ORDER — FENTANYL CITRATE PF 50 MCG/ML IJ SOSY
50.0000 ug | PREFILLED_SYRINGE | Freq: Once | INTRAMUSCULAR | Status: AC
  Administered 2021-05-13: 09:00:00 50 ug via INTRAVENOUS

## 2021-05-13 MED ORDER — 0.9 % SODIUM CHLORIDE (POUR BTL) OPTIME
TOPICAL | Status: DC | PRN
  Administered 2021-05-13: 10:00:00 1000 mL

## 2021-05-13 MED ORDER — POVIDONE-IODINE 10 % EX SWAB
2.0000 "application " | Freq: Once | CUTANEOUS | Status: AC
  Administered 2021-05-13: 2 via TOPICAL

## 2021-05-13 MED ORDER — LACTATED RINGERS IV SOLN: INTRAVENOUS | Status: DC

## 2021-05-13 MED ORDER — KETOROLAC TROMETHAMINE 15 MG/ML IJ SOLN
7.5000 mg | Freq: Four times a day (QID) | INTRAMUSCULAR | Status: AC
  Administered 2021-05-13 – 2021-05-14 (×3): 7.5 mg via INTRAVENOUS
  Filled 2021-05-13 (×3): qty 1

## 2021-05-13 MED ORDER — ROPIVACAINE HCL 7.5 MG/ML IJ SOLN
INTRAMUSCULAR | Status: DC | PRN
  Administered 2021-05-13: 20 mL via PERINEURAL

## 2021-05-13 MED ORDER — KETOROLAC TROMETHAMINE 30 MG/ML IJ SOLN
INTRAMUSCULAR | Status: AC
  Filled 2021-05-13: qty 1

## 2021-05-13 MED ORDER — FENTANYL CITRATE (PF) 100 MCG/2ML IJ SOLN
INTRAMUSCULAR | Status: AC
  Filled 2021-05-13: qty 2

## 2021-05-13 MED ORDER — VANCOMYCIN HCL IN DEXTROSE 1-5 GM/200ML-% IV SOLN
1000.0000 mg | Freq: Once | INTRAVENOUS | Status: AC
  Administered 2021-05-13: 08:00:00 1000 mg via INTRAVENOUS
  Filled 2021-05-13: qty 200

## 2021-05-13 MED ORDER — DEXAMETHASONE SODIUM PHOSPHATE 10 MG/ML IJ SOLN
INTRAMUSCULAR | Status: DC | PRN
  Administered 2021-05-13: 8 mg via INTRAVENOUS

## 2021-05-13 MED ORDER — METOCLOPRAMIDE HCL 5 MG/ML IJ SOLN: 5.0000 mg | Freq: Three times a day (TID) | INTRAMUSCULAR | Status: DC | PRN

## 2021-05-13 MED ORDER — OXYCODONE HCL 5 MG PO TABS
2.5000 mg | ORAL_TABLET | ORAL | Status: DC | PRN
  Administered 2021-05-13 – 2021-05-15 (×3): 5 mg via ORAL
  Filled 2021-05-13 (×3): qty 1

## 2021-05-13 MED ORDER — SODIUM CHLORIDE 0.9% FLUSH
INTRAVENOUS | Status: DC | PRN
  Administered 2021-05-13: 60 mL via INTRAVENOUS

## 2021-05-13 MED ORDER — BUPIVACAINE LIPOSOME 1.3 % IJ SUSP
INTRAMUSCULAR | Status: DC | PRN
  Administered 2021-05-13: 20 mL

## 2021-05-13 MED ORDER — PHENYLEPHRINE 40 MCG/ML (10ML) SYRINGE FOR IV PUSH (FOR BLOOD PRESSURE SUPPORT)
PREFILLED_SYRINGE | INTRAVENOUS | Status: DC | PRN
  Administered 2021-05-13 (×2): 80 ug via INTRAVENOUS

## 2021-05-13 MED ORDER — POTASSIUM CHLORIDE CRYS ER 10 MEQ PO TBCR
10.0000 meq | EXTENDED_RELEASE_TABLET | Freq: Every day | ORAL | Status: DC
  Administered 2021-05-14 – 2021-05-16 (×3): 10 meq via ORAL
  Filled 2021-05-13 (×3): qty 1

## 2021-05-13 MED ORDER — HYDROMORPHONE HCL 1 MG/ML IJ SOLN
0.5000 mg | INTRAMUSCULAR | Status: DC | PRN
  Administered 2021-05-13 – 2021-05-15 (×4): 0.5 mg via INTRAVENOUS
  Filled 2021-05-13 (×4): qty 0.5

## 2021-05-13 MED ORDER — RIVAROXABAN 10 MG PO TABS
10.0000 mg | ORAL_TABLET | Freq: Every day | ORAL | Status: DC
  Administered 2021-05-14 – 2021-05-16 (×3): 10 mg via ORAL
  Filled 2021-05-13 (×3): qty 1

## 2021-05-13 MED ORDER — PROPOFOL 1000 MG/100ML IV EMUL
INTRAVENOUS | Status: AC
  Filled 2021-05-13: qty 100

## 2021-05-13 MED ORDER — PROPOFOL 500 MG/50ML IV EMUL
INTRAVENOUS | Status: DC | PRN
  Administered 2021-05-13: 100 ug/kg/min via INTRAVENOUS

## 2021-05-13 MED ORDER — NON FORMULARY: 20.0000 mg | Freq: Every day | Status: DC

## 2021-05-13 MED ORDER — OXYCODONE HCL 5 MG PO TABS: 5.0000 mg | ORAL_TABLET | Freq: Once | ORAL | Status: DC | PRN

## 2021-05-13 MED ORDER — SULFAMETHOXAZOLE-TRIMETHOPRIM 800-160 MG PO TABS: 1.0000 | ORAL_TABLET | Freq: Two times a day (BID) | ORAL | Status: DC

## 2021-05-13 MED ORDER — ONDANSETRON HCL 4 MG/2ML IJ SOLN
INTRAMUSCULAR | Status: DC | PRN
  Administered 2021-05-13: 4 mg via INTRAVENOUS

## 2021-05-13 MED ORDER — DEXAMETHASONE SODIUM PHOSPHATE 10 MG/ML IJ SOLN
INTRAMUSCULAR | Status: AC
  Filled 2021-05-13: qty 1

## 2021-05-13 MED ORDER — DEXAMETHASONE SODIUM PHOSPHATE 10 MG/ML IJ SOLN
8.0000 mg | Freq: Once | INTRAMUSCULAR | Status: DC
  Filled 2021-05-13: qty 1

## 2021-05-13 MED ORDER — DOCUSATE SODIUM 100 MG PO CAPS
100.0000 mg | ORAL_CAPSULE | Freq: Two times a day (BID) | ORAL | Status: DC
  Administered 2021-05-13 – 2021-05-16 (×6): 100 mg via ORAL
  Filled 2021-05-13 (×6): qty 1

## 2021-05-13 MED ORDER — MIDAZOLAM HCL 2 MG/2ML IJ SOLN
INTRAMUSCULAR | Status: AC
  Filled 2021-05-13: qty 2

## 2021-05-13 MED ORDER — ACETAMINOPHEN 500 MG PO TABS
1000.0000 mg | ORAL_TABLET | Freq: Four times a day (QID) | ORAL | Status: AC
  Administered 2021-05-13 – 2021-05-14 (×3): 1000 mg via ORAL
  Filled 2021-05-13 (×3): qty 2

## 2021-05-13 MED ORDER — ROSUVASTATIN CALCIUM 5 MG PO TABS
10.0000 mg | ORAL_TABLET | Freq: Every day | ORAL | Status: DC
  Administered 2021-05-14 – 2021-05-16 (×3): 10 mg via ORAL
  Filled 2021-05-13 (×3): qty 2

## 2021-05-13 MED ORDER — TORSEMIDE 10 MG PO TABS
10.0000 mg | ORAL_TABLET | Freq: Every day | ORAL | Status: DC
  Administered 2021-05-14 – 2021-05-16 (×3): 10 mg via ORAL
  Filled 2021-05-13 (×3): qty 1

## 2021-05-13 SURGICAL SUPPLY — 83 items
BAG COUNTER SPONGE SURGICOUNT (BAG) ×2 IMPLANT
BANDAGE ESMARK 6X9 LF (GAUZE/BANDAGES/DRESSINGS) ×1 IMPLANT
BLADE SAG 18X100X1.27 (BLADE) ×1 IMPLANT
BLADE SAW RECIP 87.9 MT (BLADE) ×1 IMPLANT
BLADE SAW SAG 35X64 .89 (BLADE) ×1 IMPLANT
BLADE SAW SAG HD 89.5X25X.94 (BLADE) ×1 IMPLANT
BNDG ELASTIC 6X10 VLCR STRL LF (GAUZE/BANDAGES/DRESSINGS) ×2 IMPLANT
BNDG ELASTIC 6X15 VLCR STRL LF (GAUZE/BANDAGES/DRESSINGS) ×2 IMPLANT
BNDG ESMARK 6X9 LF (GAUZE/BANDAGES/DRESSINGS) ×4
BOWL SMART MIX CTS (DISPOSABLE) ×3 IMPLANT
BRUSH SCRUB EZ  4% CHG (MISCELLANEOUS) ×1
BRUSH SCRUB EZ 4% CHG (MISCELLANEOUS) IMPLANT
CEMENT BONE REFOBACIN R1X40 US (Cement) ×2 IMPLANT
CHLORAPREP W/TINT 26 (MISCELLANEOUS) ×2 IMPLANT
CLSR STERI-STRIP ANTIMIC 1/2X4 (GAUZE/BANDAGES/DRESSINGS) ×3 IMPLANT
COMP FEM PERSONA SZ7 LT (Joint) ×2 IMPLANT
COMPONENT FEM PERSONA SZ7 LT (Joint) IMPLANT
COVER SURGICAL LIGHT HANDLE (MISCELLANEOUS) ×2 IMPLANT
CUFF TOURN SGL QUICK 34 (TOURNIQUET CUFF) ×1
CUFF TRNQT CYL 34X4.125X (TOURNIQUET CUFF) ×1 IMPLANT
DERMABOND ADHESIVE PROPEN (GAUZE/BANDAGES/DRESSINGS) ×1
DERMABOND ADVANCED .7 DNX6 (GAUZE/BANDAGES/DRESSINGS) IMPLANT
DRAPE EXTREMITY T 121X128X90 (DISPOSABLE) ×2 IMPLANT
DRAPE HALF SHEET 40X57 (DRAPES) ×3 IMPLANT
DRAPE IMP U-DRAPE 54X76 (DRAPES) ×2 IMPLANT
DRAPE INCISE IOBAN 66X45 STRL (DRAPES) ×1 IMPLANT
DRAPE U-SHAPE 47X51 STRL (DRAPES) ×2 IMPLANT
DRESSING AQUACEL AG SP 3.5X10 (GAUZE/BANDAGES/DRESSINGS) IMPLANT
DRSG AQUACEL AG ADV 3.5X10 (GAUZE/BANDAGES/DRESSINGS) ×1 IMPLANT
DRSG AQUACEL AG SP 3.5X10 (GAUZE/BANDAGES/DRESSINGS) ×2
DRSG MEPILEX BORDER 4X8 (GAUZE/BANDAGES/DRESSINGS) ×2 IMPLANT
ELECT CAUTERY BLADE 6.4 (BLADE) ×2 IMPLANT
ELECT REM PT RETURN 9FT ADLT (ELECTROSURGICAL) ×2
ELECTRODE REM PT RTRN 9FT ADLT (ELECTROSURGICAL) ×1 IMPLANT
FACESHIELD WRAPAROUND (MASK) IMPLANT
FACESHIELD WRAPAROUND OR TEAM (MASK) ×2 IMPLANT
GLOVE SRG 8 PF TXTR STRL LF DI (GLOVE) ×1 IMPLANT
GLOVE SURG ENC MOIS LTX SZ7.5 (GLOVE) ×2 IMPLANT
GLOVE SURG SYN 7.5  E (GLOVE) ×1
GLOVE SURG SYN 7.5 E (GLOVE) ×1 IMPLANT
GLOVE SURG SYN 7.5 PF PI (GLOVE) ×1 IMPLANT
GLOVE SURG UNDER POLY LF SZ7.5 (GLOVE) ×2 IMPLANT
GLOVE SURG UNDER POLY LF SZ8 (GLOVE) ×1
GOWN STRL REUS W/ TWL LRG LVL3 (GOWN DISPOSABLE) ×1 IMPLANT
GOWN STRL REUS W/ TWL XL LVL3 (GOWN DISPOSABLE) ×2 IMPLANT
GOWN STRL REUS W/TWL LRG LVL3 (GOWN DISPOSABLE) ×1
GOWN STRL REUS W/TWL XL LVL3 (GOWN DISPOSABLE) ×2
HANDPIECE INTERPULSE COAX TIP (DISPOSABLE) ×1
HOOD PEEL AWAY FLYTE STAYCOOL (MISCELLANEOUS) ×3 IMPLANT
IMMOBILIZER KNEE 22 UNIV (SOFTGOODS) ×2 IMPLANT
INSERT TIB BEAR PS 6-9CD 12 (Screw) ×1 IMPLANT
KIT BASIN OR (CUSTOM PROCEDURE TRAY) ×2 IMPLANT
KIT TURNOVER KIT B (KITS) ×2 IMPLANT
MANIFOLD NEPTUNE II (INSTRUMENTS) ×2 IMPLANT
NDL 18GX1X1/2 (RX/OR ONLY) (NEEDLE) ×1 IMPLANT
NEEDLE 18GX1X1/2 (RX/OR ONLY) (NEEDLE) ×2 IMPLANT
NS IRRIG 1000ML POUR BTL (IV SOLUTION) ×2 IMPLANT
PACK TOTAL JOINT (CUSTOM PROCEDURE TRAY) ×2 IMPLANT
PAD ABD 7.5X8 STRL (GAUZE/BANDAGES/DRESSINGS) ×2 IMPLANT
PAD ARMBOARD 7.5X6 YLW CONV (MISCELLANEOUS) ×2 IMPLANT
SCREW HEADED 33MM KNEE (MISCELLANEOUS) ×2 IMPLANT
SCREW HEADED 48MM KNEE (MISCELLANEOUS) ×2 IMPLANT
SET HNDPC FAN SPRY TIP SCT (DISPOSABLE) IMPLANT
STEM POLY PAT PLY 32M KNEE (Knees) ×1 IMPLANT
STEM TIB ST PERS 14+30 (Stem) ×1 IMPLANT
STEM TIBIA 5 DEG SZ C L KNEE (Knees) IMPLANT
SUCTION FRAZIER HANDLE 10FR (MISCELLANEOUS) ×1
SUCTION TUBE FRAZIER 10FR DISP (MISCELLANEOUS) ×1 IMPLANT
SUT MNCRL AB 3-0 PS2 18 (SUTURE) ×1 IMPLANT
SUT MNCRL AB 4-0 PS2 18 (SUTURE) ×1 IMPLANT
SUT STRATAFIX 1PDS 45CM VIOLET (SUTURE) ×2 IMPLANT
SUT VIC AB 0 CT1 27 (SUTURE) ×1
SUT VIC AB 0 CT1 27XBRD ANBCTR (SUTURE) ×1 IMPLANT
SUT VIC AB 1 CT1 27 (SUTURE) ×3
SUT VIC AB 1 CT1 27XBRD ANBCTR (SUTURE) ×2 IMPLANT
SUT VIC AB 2-0 CT1 27 (SUTURE) ×1
SUT VIC AB 2-0 CT1 TAPERPNT 27 (SUTURE) IMPLANT
SYR 50ML LL SCALE MARK (SYRINGE) ×2 IMPLANT
TIBIA STEM 5 DEG SZ C L KNEE (Knees) ×2 IMPLANT
TOWEL GREEN STERILE (TOWEL DISPOSABLE) ×2 IMPLANT
TOWEL GREEN STERILE FF (TOWEL DISPOSABLE) ×2 IMPLANT
TRAY FOLEY W/BAG SLVR 14FR LF (SET/KITS/TRAYS/PACK) ×1 IMPLANT
WIPE CATHETER CARE KIT (MISCELLANEOUS) ×1 IMPLANT

## 2021-05-13 NOTE — Interval H&P Note (Signed)
The patient has been re-examined, and the chart reviewed, and there have been no interval changes to the documented history and physical.     The risks benefits and alternatives were discussed with the patient including but not limited to the risks of nonoperative treatment, versus surgical intervention including infection, bleeding, nerve injury, the need for revision surgery, hardware prominence, hardware failure, the need for hardware removal, blood clots, cardiopulmonary complications, morbidity, mortality, among others, and they were willing to proceed. The risks, benefits, and alternatives have been discussed at length with patient and daughter, and the patient is willing to proceed.  Left knee marked. Consent has been signed.

## 2021-05-13 NOTE — Transfer of Care (Signed)
Immediate Anesthesia Transfer of Care Note  Patient: Lakelyn Charles  Procedure(s) Performed: TOTAL KNEE ARTHROPLASTY (Left: Knee)  Patient Location: PACU  Anesthesia Type:MAC, Regional and Spinal  Level of Consciousness: awake and drowsy  Airway & Oxygen Therapy: Patient Spontanous Breathing  Post-op Assessment: Report given to RN and Post -op Vital signs reviewed and stable  Post vital signs: Reviewed and stable  Last Vitals:  Vitals Value Taken Time  BP    Temp    Pulse 76 05/13/21 1203  Resp 17 05/13/21 1203  SpO2 100 % 05/13/21 1203  Vitals shown include unvalidated device data.  Last Pain:  Vitals:   05/13/21 0705  TempSrc:   PainSc: 6          Complications: No notable events documented.

## 2021-05-13 NOTE — Progress Notes (Signed)
Masaye Erpelding is a 70 y.o. female patient admitted. Awake, alert - oriented  X 4 - no acute distress noted.  VSS - Blood pressure (!) 149/77, pulse 81, temperature 97.6 F (36.4 C), temperature source Oral, resp. rate 15, height 5\' 4"  (1.626 m), weight 71.7 kg, SpO2 100 %.    IV in place, occlusive dsg intact without redness.  Orientation to room, and floor completed.  Admission INP armband ID verified with patient/family, and in place.   SR up x 2, fall assessment complete, with patient and family able to verbalize understanding of risk associated with falls, and verbalized understanding to call nsg before up out of bed.  Call light within reach, patient able to voice, and demonstrate understanding. Lunch ordered No evidence of skin break down noted on exam.       Will cont to eval and treat per MD orders.  Hezzie Bump, RN 05/13/2021 8:03 PM

## 2021-05-13 NOTE — Evaluation (Signed)
Physical Therapy Evaluation Patient Details Name: Brandy Bowers MRN: [id number] DOB: 02/10/1951 Today's Date: 05/13/2021  History of Present Illness  70 yo female s/p L TKR on 12/30. PMH includes AVR, dementia.  Clinical Impression  Pt presents with generalized weakness, post-operative L knee pain, decreased L knee ROM, antalgic gait, and decreased activity tolerance. Pt to benefit from acute PT to address deficits. Pt ambulated room distance with RW, overall requiring min-mod assist for mobility. PT educated pt and family on ankle pumps and quad sets for pt to perform overnight, pt and family agreeable. PT to progress mobility as tolerated, and will continue to follow acutely.         Recommendations for follow up therapy are one component of a multi-disciplinary discharge planning process, led by the attending physician.  Recommendations may be updated based on patient status, additional functional criteria and insurance authorization.  Follow Up Recommendations Skilled nursing-short term rehab (<3 hours/day)    Assistance Recommended at Discharge Frequent or constant Supervision/Assistance  Functional Status Assessment Patient has had a recent decline in their functional status and demonstrates the ability to make significant improvements in function in a reasonable and predictable amount of time.  Equipment Recommendations  Rolling walker (2 wheels)    Recommendations for Other Services       Precautions / Restrictions Precautions Precautions: Fall Restrictions Weight Bearing Restrictions: No Other Position/Activity Restrictions: WBAT LLE      Mobility  Bed Mobility Overal bed mobility: Needs Assistance Bed Mobility: Supine to Sit     Supine to sit: HOB elevated;Mod assist     General bed mobility comments: assist for trunk elevation and scooting to EOB. Increased time, max sequencing cues    Transfers Overall transfer level: Needs assistance Equipment used: Rolling  walker (2 wheels) Transfers: Sit to/from Stand Sit to Stand: Min assist           General transfer comment: for rise and steady, cues for hand placement when rising/sitting.    Ambulation/Gait Ambulation/Gait assistance: Min assist Gait Distance (Feet): 30 Feet Assistive device: Rolling walker (2 wheels) Gait Pattern/deviations: Step-through pattern;Decreased stride length;Antalgic Gait velocity: decr     General Gait Details: assist to steady pt and guide RW, cues for upright posture, sequencing gait  Stairs            Wheelchair Mobility    Modified Rankin (Stroke Patients Only)       Balance Overall balance assessment: Needs assistance Sitting-balance support: No upper extremity supported;Feet supported Sitting balance-Leahy Scale: Fair     Standing balance support: Bilateral upper extremity supported;During functional activity;Reliant on assistive device for balance Standing balance-Leahy Scale: Poor                               Pertinent Vitals/Pain Pain Assessment: 0-10 Pain Score: 7  Pain Location: L knee Pain Descriptors / Indicators: Sore;Operative site guarding Pain Intervention(s): Limited activity within patient's tolerance;Monitored during session;Repositioned    Home Living Family/patient expects to be discharged to:: Skilled nursing facility Living Arrangements: Alone                      Prior Function Prior Level of Function : Needs assist  Cognitive Assist : Mobility (cognitive);ADLs (cognitive) Mobility (Cognitive): Intermittent cues ADLs (Cognitive): Intermittent cues Physical Assist : Mobility (physical) Mobility (physical): Gait   Mobility Comments: pt using cane for gait PTA ADLs Comments: Pt's brother at bedside  states pt was requiring daytime assist for ADLs, states she is alone at night     Hand Dominance   Dominant Hand: Right    Extremity/Trunk Assessment   Upper Extremity Assessment Upper  Extremity Assessment: Defer to OT evaluation    Lower Extremity Assessment Lower Extremity Assessment: Generalized weakness;RLE deficits/detail RLE Deficits / Details: anticipated post-operative weakness; able to perform quad set, heel slide to 45*, and SLR without quad lag    Cervical / Trunk Assessment Cervical / Trunk Assessment: Normal  Communication   Communication: No difficulties  Cognition Arousal/Alertness: Awake/alert Behavior During Therapy: WFL for tasks assessed/performed;Anxious Overall Cognitive Status: History of cognitive impairments - at baseline                                 General Comments: history of dementia        General Comments      Exercises General Exercises - Lower Extremity Heel Slides: AAROM;Left;5 reps;Supine   Assessment/Plan    PT Assessment Patient needs continued PT services  PT Problem List Decreased strength;Decreased mobility;Decreased safety awareness;Decreased cognition;Decreased activity tolerance;Decreased balance;Decreased knowledge of use of DME;Pain;Decreased knowledge of precautions       PT Treatment Interventions DME instruction;Therapeutic activities;Gait training;Therapeutic exercise;Patient/family education;Balance training;Functional mobility training;Neuromuscular re-education    PT Goals (Current goals can be found in the Care Plan section)  Acute Rehab PT Goals PT Goal Formulation: With patient/family Time For Goal Achievement: 05/20/21 Potential to Achieve Goals: Good    Frequency 7X/week   Barriers to discharge        Co-evaluation               AM-PAC PT "6 Clicks" Mobility  Outcome Measure Help needed turning from your back to your side while in a flat bed without using bedrails?: A Little Help needed moving from lying on your back to sitting on the side of a flat bed without using bedrails?: A Lot Help needed moving to and from a bed to a chair (including a wheelchair)?: A  Lot Help needed standing up from a chair using your arms (e.g., wheelchair or bedside chair)?: A Little Help needed to walk in hospital room?: A Little Help needed climbing 3-5 steps with a railing? : A Lot 6 Click Score: 15    End of Session Equipment Utilized During Treatment: Gait belt Activity Tolerance: Patient tolerated treatment well Patient left: in chair;with call bell/phone within reach;with chair alarm set;with family/visitor present Nurse Communication: Mobility status PT Visit Diagnosis: Other abnormalities of gait and mobility (R26.89);Difficulty in walking, not elsewhere classified (R26.2);Pain Pain - Right/Left: Left Pain - part of body: Knee    Time: 0347-4259 PT Time Calculation (min) (ACUTE ONLY): 30 min   Charges:   PT Evaluation $PT Eval Low Complexity: 1 Low PT Treatments $Gait Training: 8-22 mins       Stacie Glaze, PT DPT Acute Rehabilitation Services Pager [phone]  Office [phone]   Roxine Caddy E Ruffin Pyo 05/13/2021, 4:55 PM

## 2021-05-13 NOTE — H&P (Shared)
Bottom is noted to be red, is blanchable. Needs some cream as it is Moisture associated between her gluteus maximus folds.

## 2021-05-13 NOTE — Anesthesia Procedure Notes (Signed)
Procedure Name: MAC Date/Time: 05/13/2021 9:02 AM Performed by: Dorann Lodge, CRNA Pre-anesthesia Checklist: Patient identified, Emergency Drugs available, Suction available, Patient being monitored and Timeout performed Patient Re-evaluated:Patient Re-evaluated prior to induction Oxygen Delivery Method: Simple face mask Preoxygenation: Pre-oxygenation with 100% oxygen Dental Injury: Teeth and Oropharynx as per pre-operative assessment

## 2021-05-13 NOTE — Anesthesia Postprocedure Evaluation (Signed)
Anesthesia Post Note  Patient: [name]  Procedure(s) Performed: TOTAL KNEE ARTHROPLASTY (Left: Knee)     Patient location during evaluation: PACU Anesthesia Type: Spinal Level of consciousness: awake and alert Pain management: pain level controlled Vital Signs Assessment: post-procedure vital signs reviewed and stable Respiratory status: spontaneous breathing and respiratory function stable Cardiovascular status: blood pressure returned to baseline and stable Postop Assessment: spinal receding and no apparent nausea or vomiting Anesthetic complications: no   No notable events documented.  Last Vitals:  Vitals:   05/13/21 1400 05/13/21 1401  BP:  (!) 151/68  Pulse: 80 81  Resp: (!) 22 20  Temp:    SpO2: 100% 100%    Last Pain:  Vitals:   05/13/21 1401  TempSrc:   PainSc: 0-No pain    LLE Motor Response: Purposeful movement (05/13/21 1401) LLE Sensation: Decreased (05/13/21 1401) RLE Motor Response: Purposeful movement (05/13/21 1401) RLE Sensation: Full sensation (05/13/21 1401) L Sensory Level: S1-Sole of foot, small toes (05/13/21 1401) R Sensory Level: S1-Sole of foot, small toes (05/13/21 1401)  Brandy Bowers

## 2021-05-13 NOTE — Op Note (Signed)
DATE OF SURGERY:  05/13/2021 TIME: 11:46 AM  PATIENT NAME:  Brandy Bowers   AGE: 70 y.o.    PRE-OPERATIVE DIAGNOSIS: Left knee end-stage osteoarthritis with severe varus deformity and preop stiffness range of motion 10 to 90 degrees  POST-OPERATIVE DIAGNOSIS:  Same  PROCEDURE: Left total Knee Arthroplasty  SURGEON:  Dacian Orrico A Octavis Sheeler, MD   ASSISTANT: Luna Glasgow, PA-C, present and scrubbed throughout the case, critical for assistance with exposure, retraction, instrumentation, and closure.   OPERATIVE IMPLANTS:  Implant Name Type Inv. Item Serial No. Manufacturer Lot No. LRB No. Used Action  CEMENT BONE REFOBACIN [id number] [place] - [id number] Cement CEMENT BONE REFOBACIN [id number] [place]  ZIMMER RECON(ORTH,TRAU,BIO,SG) [id number] Left 2 Implanted  TIBIA STEM 5 DEG SZ C L KNEE - [id number] Knees TIBIA STEM 5 DEG SZ C L KNEE  ZIMMER [employer]ON(ORTH,TRAU,BIO,SG) [id number] Left 1 Implanted  STEM TIB ST PERS 14+30 - [id number] Stem STEM TIB ST PERS 14+30  ZIMMER [employer]ON(ORTH,TRAU,BIO,SG) [id number] Left 1 Implanted  COMP FEM PERSONA SZ7 LT - [id number] Joint COMP FEM PERSONA SZ7 LT  Z[employer]MER [employer]ON(ORTH,TRAU,BIO,SG) [id number] Left 1 Implanted  STEM POLY PAT PLY 49M KNEE - [id number] Knees STEM POLY PAT PLY 49M KNEE  ZIMMER RECON(ORTH,TRAU,BIO,SG) [id number] Left 1 Implanted  POLYETHLENE ARTICULAR SURFACE CONSTRAINED POSTERIOR STABILIZED Knees   ZIMMER KNEE [id number] Left 1 Implanted      PREOPERATIVE INDICATIONS:  Brandy Bowers is a 70 y.o. year old female with end stage bone on bone degenerative arthritis of the knee who failed conservative treatment, including injections, antiinflammatories, activity modification, and assistive devices, and had significant impairment of their activities of daily living, and elected for Total Knee Arthroplasty.   The risks, benefits, and alternatives were discussed at length including but not limited to the risks of infection, bleeding, nerve injury, stiffness, blood clots, the need  for revision surgery, cardiopulmonary complications, among others, and they were willing to proceed.  OPERATIVE FINDINGS AND UNIQUE ASPECTS OF THE CASE: Significant synovitis resected during the case, preop stiffness range of motion only 10-90 preop; 0-120 postop, severe varus deformity significant soft tissue releases medially including large medial release, releases semimembranosus, medial reduction osteotomy, and pie crusting of the MCL, significant lateral laxity requiring constrained PS liner to achieve varus/valgus stability  ESTIMATED BLOOD LOSS: 50cc  OPERATIVE DESCRIPTION:   Once adequate anesthesia, preoperative antibiotics, 1 gm of vancomycin, 400mg  of ciprofloxacin, and 1 gm of Tranexamic Acid administered, the patient was positioned supine with a left thigh tourniquet placed.  The left lower extremity was prepped and draped in sterile fashion.  A time-  out was performed identifying the patient, planned procedure, and the appropriate extremity.     The leg was  exsanguinated, tourniquet elevated to 250 mmHg.  A midline incision was   made followed by median parapatellar arthrotomy. Anterior horn of the medial meniscus was released and resected. A medial release was performed, the infrapatellar fat pad was resected with care taken to protect the patellar tendon. The suprapatellar fat was removed to exposed the distal anterior femur. The anterior horn of the lateral meniscus and ACL were released.    Following initial  exposure, attention was first to the femur.  The femoral canal was opened with a drill,  and suctioned to try to prevent fat emboli.  An   intramedullary rod was passed set at 5 degrees valgus, 11 mm. The distal femur was resected.  Following this resection, the tibia was   subluxated anteriorly.  Using the extramedullary guide, 10  mm of bone was resected off   the proximal lateral tibia.  With resection the extension gap was still tight and an additional 2 mm resection was  performed. we confirmed that we had good alignment and full extension with a size 10 spacer block as well as confirmed that the tibial cut was perpendicular in the coronal plane, checking with an alignment rod.  Notably the medial side was much tighter than the lateral side.   Once this was done, the posterior femoral referencing femoral sizer was placed under to the posterior condyles with 3 degrees of external rotational. The femur was sized to be a size 7 in the anterior-  posterior dimension. The  anterior, posterior, and  chamfer cuts were made without difficulty nor   notching making certain that I was along the anterior cortex to help  with flexion gap stability.       At this point, the tibia was sized to be a size C.  We lateralized the tibial tray leaving some exposed medial tibia.  The size C tray was   then pinned in position.  The excess medial tibia was carefully removed with a osteotome and rongeur to help open up the medial side.  Additionally during this process a semimembranosus was released and all medial osteophytes were removed.  The box was cut for the PS femur.  Trial reduction was now carried with a 7 femur,  C tibia, a 10 mm PS insert.  The medial side felt tight and the lateral side had about 4 to 5 mm of laxity, thus we elected to piecrust the MCL A to P at the level of the joint line with a 18-gauge needle, we achieved some relaxation of the medial soft tissues and inserted a 12 PS insert, which had improved varus valgus stability, full extension but still about 3 to 4 mm of laxity on the lateral side, thus we elected to convert to a CPS poly.  Attention was next directed to the patella.  Precut  measurement was noted to be 21 mm.  I resected down to 13 mm and used a  32 patellar button to restore patellar height as well as cover the cut surface.     The patella lug holes were drilled and a 32 mm patella poly trial was placed.    The knee was brought to full extension with  good flexion stability with the patella   tracking through the trochlea without application of pressure.     Next the femoral component was again assessed and determined to be seated and appropriately lateralized.  The femoral lug holes were drilled.  The femoral component was then removed.Tibial component was again assessed and felt to be seated and appropriately rotated with the medial third of the tubercle. The tibia was then drilled for a 30 mm stem, and keel punched.     Final components were  opened and antibiotic cement was mixed.      Final implants were then  cemented onto cleaned and dried cut surfaces of bone with the knee brought to extension with a 12 mm PS poly.  The knee was irrigated with pulse lavage normal saline. The synovial lining was  then injected a dilute Exparel.      Once the cement had fully cured, excess cement was removed   throughout the knee.  I confirmed that I was satisfied with the range of   motion and stability, and the final 12 mm CPS poly insert  was chosen.  It was   placed into the knee.         The tourniquet was let down.  No significant   hemostasis was required.  The medial parapatellar arthrotomy was then reapproximated using #1 Stratafix sutures with the knee  in flexion.  The   remaining wound was closed with 0 stratafix, 2-0 Vicryl, and running 3-0 Monocryl.   The knee was cleaned, dried, dressed sterilely using Dermabond and   Aquacel dressing.  The patient was then  brought to recovery room in stable condition, tolerating the procedure  well. There were no complications.   Post op recs: WB: WBAT LLE Abx: vanco and cipro x23 hours post op (given hx of SOB with penicillin abx) extended abx with PO with bactrim x7 days (increased infection risk given hx of lymphedema) Imaging: PACU xrays DVT prophylaxis: Xarelto 10mg  daily x4 weeks Follow up: 2 weeks after surgery for a wound check with Dr. Zachery Dakins at [facility].  Address:  [address], [place], [place] [address]  Office Phone: [phone]  Charlies Constable, MD Orthopaedic Surgery

## 2021-05-13 NOTE — Progress Notes (Signed)
Peripheral IV and Foley catheter removed by pt. Pt confused and agitated. Able to redirect pt and reorient to calm pt.

## 2021-05-13 NOTE — Anesthesia Procedure Notes (Signed)
Spinal  Patient location during procedure: OR Start time: 05/13/2021 9:05 AM End time: 05/13/2021 9:12 AM Reason for block: surgical anesthesia Staffing Performed: anesthesiologist  Anesthesiologist: Audry Pili, MD Preanesthetic Checklist Completed: patient identified, IV checked, risks and benefits discussed, surgical consent, monitors and equipment checked, pre-op evaluation and timeout performed Spinal Block Patient position: sitting Prep: DuraPrep Patient monitoring: heart rate, cardiac monitor, continuous pulse ox and blood pressure Approach: right paramedian Location: L2-3 Injection technique: single-shot Needle Needle type: Quincke  Needle gauge: 22 G Additional Notes Consent was obtained prior to the procedure with all questions answered and concerns addressed. Risks including, but not limited to, bleeding, infection, nerve damage, paralysis, failed block, inadequate analgesia, allergic reaction, high spinal, itching, and headache were discussed and the patient wished to proceed. Functioning IV was confirmed and monitors were applied. Sterile prep and drape, including hand hygiene, mask, and sterile gloves were used. The patient was positioned and the spine was prepped. The skin was anesthetized with lidocaine. Free flow of clear CSF was obtained prior to injecting local anesthetic into the CSF. The spinal needle aspirated freely following injection. The needle was carefully withdrawn. The patient tolerated the procedure well.   Renold Don, MD

## 2021-05-13 NOTE — Anesthesia Procedure Notes (Addendum)
Anesthesia Regional Block: Adductor canal block   Pre-Anesthetic Checklist: , timeout performed,  Correct Patient, Correct Site, Correct Laterality,  Correct Procedure, Correct Position, site marked,  Risks and benefits discussed,  Surgical consent,  Pre-op evaluation,  At surgeon's request and post-op pain management  Laterality: Left  Prep: chloraprep       Needles:  Injection technique: Single-shot  Needle Type: Echogenic Needle     Needle Length: 10cm  Needle Gauge: 21     Additional Needles:   Narrative:  Start time: 05/13/2021 8:35 AM End time: 05/13/2021 8:38 AM Injection made incrementally with aspirations every 5 mL.  Performed by: Personally  Anesthesiologist: Audry Pili, MD  Additional Notes: No pain on injection. No increased resistance to injection. Injection made in 5cc increments. Good needle visualization. Patient tolerated the procedure well.

## 2021-05-14 LAB — BASIC METABOLIC PANEL
Anion gap: 8 (ref 5–15)
BUN: 17 mg/dL (ref 8–23)
CO2: 24 mmol/L (ref 22–32)
Calcium: 8.8 mg/dL — ABNORMAL LOW (ref 8.9–10.3)
Chloride: 98 mmol/L (ref 98–111)
Creatinine, Ser: 1.02 mg/dL — ABNORMAL HIGH (ref 0.44–1.00)
GFR, Estimated: 60 mL/min — ABNORMAL LOW (ref >60)
Glucose, Bld: 146 mg/dL — ABNORMAL HIGH (ref 70–99)
Potassium: 4.3 mmol/L (ref 3.5–5.1)
Sodium: 130 mmol/L — ABNORMAL LOW (ref 135–145)

## 2021-05-14 LAB — CBC
HCT: 29.9 % — ABNORMAL LOW (ref 36.0–46.0)
Hemoglobin: 10.3 g/dL — ABNORMAL LOW (ref 12.0–15.0)
MCH: 31.9 pg (ref 26.0–34.0)
MCHC: 34.4 g/dL (ref 30.0–36.0)
MCV: 92.6 fL (ref 80.0–100.0)
Platelets: 262 10*3/uL (ref 150–400)
RBC: 3.23 MIL/uL — ABNORMAL LOW (ref 3.87–5.11)
RDW: 13.1 % (ref 11.5–15.5)
WBC: 14.2 10*3/uL — ABNORMAL HIGH (ref 4.0–10.5)
nRBC: 0 % (ref 0.0–0.2)

## 2021-05-14 MED ORDER — SULFAMETHOXAZOLE-TRIMETHOPRIM 800-160 MG PO TABS
1.0000 | ORAL_TABLET | Freq: Two times a day (BID) | ORAL | Status: DC
  Administered 2021-05-14 (×2): 1 via ORAL
  Filled 2021-05-14 (×2): qty 1

## 2021-05-14 NOTE — TOC Progression Note (Addendum)
Transition of Care [facility]) - Progression Note    Patient Details  Name: Brandy Bowers MRN: [id number] Date of Birth: 1951-02-23  Transition of Care [facility]) CM/SW [place], [place] Phone Number: 05/14/2021, 11:12 AM  Clinical Narrative:    CSW attempted to contact pt's sister Lambert Mody , [place] to complete assessment on pt. Pt is oriented to self only. CSW not able to leave message for pt's sister due to mailbox being full.  TOC will continue to assist for disposition.       Expected Discharge Plan and Services                                                 Social Determinants of Health (SDOH) Interventions    Readmission Risk Interventions No flowsheet data found.

## 2021-05-14 NOTE — Evaluation (Signed)
Occupational Therapy Evaluation Patient Details Name: Brandy Bowers MRN: [id number] DOB: 1951/01/21 Today's Date: 05/14/2021   History of Present Illness 70 yo female s/p L TKR on 12/30. PMH includes AVR, dementia.   Clinical Impression   Patient admitted for the procedure above.  PTA the patient states she lives alone in an apartment, but family does provide PRN assist along with what souls like PCA assist during the day.  Biggest deficit is baseline cognitive impairment and corresponding safety concerns.  Currently she is needing up to Mod A for lower body ADL, and Min A for basic mobility in her room at RW level.   OT will follow acutely, and SNF has been recommended for post acute rehab prior to home.  The patient could go home if family is able to provide near 24 hour assist, along with [facility] services.       Recommendations for follow up therapy are one component of a multi-disciplinary discharge planning process, led by the attending physician.  Recommendations may be updated based on patient status, additional functional criteria and insurance authorization.   Follow Up Recommendations  Skilled nursing-short term rehab (<3 hours/day)    Assistance Recommended at Discharge Frequent or constant Supervision/Assistance  Functional Status Assessment  Patient has had a recent decline in their functional status and demonstrates the ability to make significant improvements in function in a reasonable and predictable amount of time.  Equipment Recommendations  Tub/shower seat;BSC/3in1    Recommendations for Other Services       Precautions / Restrictions Precautions Precautions: Fall Restrictions Other Position/Activity Restrictions: WBAT LLE      Mobility Bed Mobility Overal bed mobility: Needs Assistance Bed Mobility: Supine to Sit     Supine to sit: Mod assist          Transfers Overall transfer level: Needs assistance Equipment used: Rolling walker (2 wheels) Transfers:  Sit to/from Stand Sit to Stand: Min assist                  Balance Overall balance assessment: Needs assistance Sitting-balance support: No upper extremity supported;Feet supported Sitting balance-Leahy Scale: Fair     Standing balance support: Bilateral upper extremity supported;During functional activity;Reliant on assistive device for balance Standing balance-Leahy Scale: Poor                             ADL either performed or assessed with clinical judgement   ADL Overall ADL's : Needs assistance/impaired Eating/Feeding: Set up;Bed level   Grooming: Wash/dry hands;Wash/dry face;Min guard;Standing   Upper Body Bathing: Supervision/ safety;Sitting   Lower Body Bathing: Minimal assistance;Sit to/from stand   Upper Body Dressing : Set up;Sitting   Lower Body Dressing: Moderate assistance;Sit to/from stand   Toilet Transfer: Minimal assistance;Rolling walker (2 wheels)   Toileting- Clothing Manipulation and Hygiene: Supervision/safety;Sitting/lateral lean               Vision Baseline Vision/History: 1 Wears glasses Patient Visual Report: No change from baseline       Perception Perception Perception: Not tested   Praxis Praxis Praxis: Not tested    Pertinent Vitals/Pain Pain Assessment: Faces Faces Pain Scale: Hurts little more Pain Location: L knee Pain Descriptors / Indicators: Sore;Operative site guarding Pain Intervention(s): Monitored during session     Hand Dominance Right   Extremity/Trunk Assessment Upper Extremity Assessment Upper Extremity Assessment: Overall WFL for tasks assessed   Lower Extremity Assessment Lower Extremity Assessment: Defer to  PT evaluation   Cervical / Trunk Assessment Cervical / Trunk Assessment: Normal   Communication Communication Communication: No difficulties   Cognition Arousal/Alertness: Awake/alert Behavior During Therapy: WFL for tasks assessed/performed;Anxious Overall Cognitive  Status: History of cognitive impairments - at baseline                                 General Comments: reduced command following.  Needs tactile cueing     General Comments       Exercises     Shoulder Instructions      Home Living Family/patient expects to be discharged to:: Private residence Living Arrangements: Alone Available Help at Discharge: Family;Available PRN/intermittently Type of Home: Apartment Home Access: Level entry     Home Layout: One level     Bathroom Shower/Tub: Teacher, early years/pre: Standard     Home Equipment: Cane - single point          Prior Functioning/Environment Prior Level of Function : Needs assist  Cognitive Assist : Mobility (cognitive);ADLs (cognitive) Mobility (Cognitive): Intermittent cues ADLs (Cognitive): Intermittent cues Physical Assist : Mobility (physical) Mobility (physical): Gait   Mobility Comments: pt using cane for gait PTA ADLs Comments: Pt's brother at bedside states pt was requiring daytime assist for ADLs, states she is alone at night        OT Problem List: Decreased range of motion;Impaired balance (sitting and/or standing);Decreased safety awareness;Decreased cognition      OT Treatment/Interventions: Self-care/ADL training;Therapeutic activities;DME and/or AE instruction;Patient/family education;Balance training    OT Goals(Current goals can be found in the care plan section) Acute Rehab OT Goals Patient Stated Goal: would like to return home OT Goal Formulation: With patient Time For Goal Achievement: 05/28/21 Potential to Achieve Goals: Good ADL Goals Pt Will Perform Lower Body Bathing: with supervision;sit to/from stand Pt Will Perform Lower Body Dressing: with supervision;sit to/from stand Pt Will Transfer to Toilet: with modified independence;regular height toilet;ambulating  OT Frequency: Min 2X/week   Barriers to D/C: Decreased caregiver support           Co-evaluation              AM-PAC OT "6 Clicks" Daily Activity     Outcome Measure Help from another person eating meals?: None Help from another person taking care of personal grooming?: None Help from another person toileting, which includes using toliet, bedpan, or urinal?: A Little Help from another person bathing (including washing, rinsing, drying)?: A Lot Help from another person to put on and taking off regular upper body clothing?: None Help from another person to put on and taking off regular lower body clothing?: A Lot 6 Click Score: 19   End of Session Equipment Utilized During Treatment: Rolling walker (2 wheels);Gait belt Nurse Communication: Mobility status  Activity Tolerance: Patient tolerated treatment well Patient left: in chair;with call bell/phone within reach;with chair alarm set  OT Visit Diagnosis: Unsteadiness on feet (R26.81);Other symptoms and signs involving cognitive function                Time: 1219-7588 OT Time Calculation (min): 25 min Charges:  OT General Charges $OT Visit: 1 Visit OT Evaluation $OT Eval Moderate Complexity: 1 Mod OT Treatments $Self Care/Home Management : 8-22 mins  05/14/2021  RP, OTR/L  Acute Rehabilitation Services  Office:  [phone]   Metta Clines 05/14/2021, 9:30 AM

## 2021-05-14 NOTE — TOC Initial Note (Deleted)
Transition of Care [facility]) - Initial/Assessment Note    Patient Details  Name: Brandy Bowers MRN: [id number] Date of Birth: 1950-09-28  [facility] CM/SW Contact:    Gabrielle Dare Phone Number: 05/14/2021, 11:35 AM  Clinical Narrative:                  CSW spoke with pt's sister Lambert Mody, [place] and HCPA for pt.  Pt has lived in [place], [place] since 2013-08-09.  Pt's spouse died in 09-Aug-2012 according to pt's sisiter Ivin Booty.  Pt lives alone.  Pt's sister stated " pt has received home health in home and neighbor assisting pt until surgery.  Pt's sister Ivin Booty prefers Public relations account executive in [place] for [employer]. Ivin Booty spoke with Olivia Mackie at [facility] in [place] for placement. Pt's sister stated "Olivia Mackie at [facility] at [place] said it will be better for pt to come on Monday instead of weekend. CSW will contact [facility] and [place] to confirm placement for rehab.  TOC will continue to assist for disposition planning.       Patient Goals and CMS Choice        Expected Discharge Plan and Services                                                Prior Living Arrangements/Services                       Activities of Daily Living Home Assistive Devices/Equipment: Eyeglasses, Kasandra Knudsen (specify quad or straight), Dentures (specify type) ADL Screening (condition at time of admission) Patient's cognitive ability adequate to safely complete daily activities?: Yes Is the patient deaf or have difficulty hearing?: No Does the patient have difficulty seeing, even when wearing glasses/contacts?: No Does the patient have difficulty concentrating, remembering, or making decisions?: Yes Patient able to express need for assistance with ADLs?: Yes Does the patient have difficulty dressing or bathing?: Yes Independently performs ADLs?: No Communication: Independent Dressing (OT): Needs assistance Is this a change from baseline?: Pre-admission baseline Grooming: Needs assistance Feeding:  Independent Bathing: Needs assistance Is this a change from baseline?: Pre-admission baseline Toileting: Independent In/Out Bed: Independent with device (comment) Is this a change from baseline?: Pre-admission baseline Walks in Home: Independent with device (comment) Does the patient have difficulty walking or climbing stairs?: Yes Weakness of Legs: Left Weakness of Arms/Hands: None  Permission Sought/Granted                  Emotional Assessment              Admission diagnosis:  Localized osteoarthritis of left knee [M17.12] Patient Active Problem List   Diagnosis Date Noted   Localized osteoarthritis of left knee 05/13/2021   PCP:  Street, Sharon Mt, MD Pharmacy:  No Pharmacies Listed    Social Determinants of Health (SDOH) Interventions    Readmission Risk Interventions No flowsheet data found.

## 2021-05-14 NOTE — Progress Notes (Signed)
Physical Therapy Treatment Patient Details Name: Brandy Bowers MRN: [id number] DOB: 1950-11-03 Today's Date: 05/14/2021   History of Present Illness 70 yo female s/p L TKR on 12/30. PMH includes AVR, dementia.    PT Comments    Today's skilled session continued to focus on mobility progression and LE ROM/strengthening. Limited this session due to pt fatigue. Acute PT to continue during pt's hospital stay.    Recommendations for follow up therapy are one component of a multi-disciplinary discharge planning process, led by the attending physician.  Recommendations may be updated based on patient status, additional functional criteria and insurance authorization.  Follow Up Recommendations  Skilled nursing-short term rehab (<3 hours/day)     Assistance Recommended at Discharge Frequent or constant Supervision/Assistance  Equipment Recommendations  Rolling walker (2 wheels)       Precautions / Restrictions Precautions Precautions: Fall Restrictions Other Position/Activity Restrictions: WBAT LLE     Balance Overall balance assessment: Needs assistance Sitting-balance support: No upper extremity supported;Feet supported Sitting balance-Leahy Scale: Fair     Standing balance support: Bilateral upper extremity supported;During functional activity;Reliant on assistive device for balance Standing balance-Leahy Scale: Poor                Cognition Arousal/Alertness: Lethargic;Suspect due to medications (pt drifting to sleep with session) Behavior During Therapy: [facility] for tasks assessed/performed Overall Cognitive Status: History of cognitive impairments - at baseline                 General Comments: awoke and agreed to PT. reporting pain in the left knee, however pt drifting to sleep during ex's and unable to maintain arousal toward end of session to fully participate. RN notified.        Exercises Total Joint Exercises Ankle Circles/Pumps: AROM;Both;10  reps;Seated;Limitations Ankle Circles/Pumps Limitations: with foot rest up on recliner Quad Sets: AAROM;Strengthening;Left;10 reps;Limitations;Seated Quad Sets Limitations: tactile cues with overpressure at quads needed Heel Slides: AAROM;Strengthening;Left;10 reps;Seated;Limitations Heel Slides Limitations: assist needed for increased range of motion and controlled movements Hip ABduction/ADduction: PROM;Strengthening;Left;10 reps;Seated;Limitations Hip Abduction/Adduction Limitations: pt unable to maintain arousal  to assist with this exercise Straight Leg Raises: AAROM;PROM;Strengthening;Left;10 reps;Seated;Limitations Straight Leg Raises Limitations: pt assisting with first few reps with cues to wake up due to drifting to sleep, remainder of reps passive due to pt unable to maintain arousal to participate Goniometric ROM: 5-70 degrees knee flexion     Pertinent Vitals/Pain Pain Assessment: Faces Faces Pain Scale: Hurts a little bit Pain Location: L knee Pain Descriptors / Indicators: Sore;Operative site guarding Pain Intervention(s): Limited activity within patient's tolerance;Monitored during session;Premedicated before session;Repositioned    Home Living Family/patient expects to be discharged to:: Private residence Living Arrangements: Alone Available Help at Discharge: Family;Available PRN/intermittently Type of Home: Apartment Home Access: Level entry       Home Layout: One level Home Equipment: Cane - single point       PT Goals (current goals can now be found in the care plan section) Acute Rehab PT Goals PT Goal Formulation: With patient Time For Goal Achievement: 05/20/21 Potential to Achieve Goals: Good Progress towards PT goals: Progressing toward goals    Frequency    7X/week      PT Plan Current plan remains appropriate    AM-PAC PT "6 Clicks" Mobility   Outcome Measure  Help needed turning from your back to your side while in a flat bed without  using bedrails?: A Little Help needed moving from lying on your back to sitting  on the side of a flat bed without using bedrails?: A Lot Help needed moving to and from a bed to a chair (including a wheelchair)?: A Lot Help needed standing up from a chair using your arms (e.g., wheelchair or bedside chair)?: A Little Help needed to walk in hospital room?: A Little Help needed climbing 3-5 steps with a railing? : A Lot 6 Click Score: 15    End of Session   Activity Tolerance: Patient tolerated treatment well;Patient limited by lethargy Patient left: in chair;with call bell/phone within reach;with chair alarm set Nurse Communication: Mobility status PT Visit Diagnosis: Other abnormalities of gait and mobility (R26.89);Difficulty in walking, not elsewhere classified (R26.2);Pain Pain - Right/Left: Left Pain - part of body: Knee     Time: 1145-1200 PT Time Calculation (min) (ACUTE ONLY): 15 min  Charges:  $Therapeutic Exercise: 8-22 mins                    Willow Ora, PTA, [facility] Acute Rehab Services Office- [phone] 05/14/21, 12:21 PM   Willow Ora 05/14/2021, 12:20 PM

## 2021-05-14 NOTE — NC FL2 (Signed)
[place] MEDICAID FL2 LEVEL OF CARE SCREENING TOOL     IDENTIFICATION  Patient Name: Brandy Bowers Birthdate: 1950-08-01 Sex: female Admission Date (Current Location): 05/13/2021  [facility] and [place] Number:  Herbalist and Address:  [facility], [place] [address] [place], [place] [address]      Provider Number: [id number]  Attending Physician Name and Address:  Willaim Sheng, MD  Relative Name and Phone Number:  Lambert Mody [phone]    Current Level of Care: Hospital Recommended Level of Care: [place] Prior Approval Number:    Date Approved/Denied:   PASRR Number: [id number]  Discharge Plan: SNF    Current Diagnoses: Patient Active Problem List   Diagnosis Date Noted   Localized osteoarthritis of left knee 05/13/2021    Orientation RESPIRATION BLADDER Height & Weight     Self  Normal   Weight: 158 lb (71.7 kg) Height:  5\' 4"  (162.6 cm)  BEHAVIORAL SYMPTOMS/MOOD NEUROLOGICAL BOWEL NUTRITION STATUS      Continent Diet (See Discharge Summary)  AMBULATORY STATUS COMMUNICATION OF NEEDS Skin   Limited Assist Verbally Surgical wounds (Left knee gauze abdominal pads.)                       Personal Care Assistance Level of Assistance  Bathing, Dressing, Feeding Bathing Assistance: Limited assistance Feeding assistance: Independent Dressing Assistance: Limited assistance     Functional Limitations Info  Sight Sight Info: Impaired        SPECIAL CARE FACTORS FREQUENCY  PT (By licensed PT)     PT Frequency: 5 x week              Contractures Contractures Info: Not present    Additional Factors Info  Code Status, Allergies Code Status Info: Full Allergies Info: Penicillins           Current Medications (05/14/2021):  This is the current hospital active medication list Current Facility-Administered Medications  Medication Dose Route Frequency Provider Last Rate Last Admin    acetaminophen (TYLENOL) tablet 325-650 mg  325-650 mg Oral Q6H PRN Willaim Sheng, MD       docusate sodium (COLACE) capsule 100 mg  100 mg Oral BID Willaim Sheng, MD   100 mg at 05/14/21 6834   HYDROmorphone (DILAUDID) injection 0.5 mg  0.5 mg Intravenous Q4H PRN Willaim Sheng, MD   0.5 mg at 05/13/21 2215   irbesartan (AVAPRO) tablet 37.5 mg  37.5 mg Oral Daily Willaim Sheng, MD   37.5 mg at 05/14/21 0839   memantine (NAMENDA) tablet 5 mg  5 mg Oral BID Willaim Sheng, MD   5 mg at 05/14/21 1962   menthol-cetylpyridinium (CEPACOL) lozenge 3 mg  1 lozenge Oral PRN Willaim Sheng, MD       Or   phenol (CHLORASEPTIC) mouth spray 1 spray  1 spray Mouth/Throat PRN Willaim Sheng, MD       metoCLOPramide (REGLAN) tablet 5-10 mg  5-10 mg Oral Q8H PRN Willaim Sheng, MD       Or   metoCLOPramide (REGLAN) injection 5-10 mg  5-10 mg Intravenous Q8H PRN Willaim Sheng, MD       ondansetron [facility]) tablet 4 mg  4 mg Oral Q6H PRN Willaim Sheng, MD       Or   ondansetron (ZOFRAN) injection 4 mg  4 mg Intravenous Q6H PRN Willaim Sheng, MD  oxyCODONE (Oxy IR/ROXICODONE) immediate release tablet 2.5-5 mg  2.5-5 mg Oral Q4H PRN Willaim Sheng, MD   5 mg at 05/14/21 1132   potassium chloride (KLOR-CON M) CR tablet 10 mEq  10 mEq Oral Daily Willaim Sheng, MD   10 mEq at 05/14/21 7782   rivaroxaban (XARELTO) tablet 10 mg  10 mg Oral Q breakfast Willaim Sheng, MD   10 mg at 05/14/21 4235   rosuvastatin (CRESTOR) tablet 10 mg  10 mg Oral Daily Willaim Sheng, MD   10 mg at 05/14/21 0840   sulfamethoxazole-trimethoprim (BACTRIM DS) 800-160 MG per tablet 1 tablet  1 tablet Oral Q12H Willaim Sheng, MD   1 tablet at 05/14/21 0840   torsemide (DEMADEX) tablet 10 mg  10 mg Oral Daily Willaim Sheng, MD   10 mg at 05/14/21 3614     Discharge Medications: Please see discharge summary for a list of discharge  medications.  Relevant Imaging Results:  Relevant Lab Results:   Additional Information SSN: [id number] ; Not covid vaccinated  Carolin Sicks, LCSWA

## 2021-05-14 NOTE — TOC Initial Note (Addendum)
Transition of Care [facility]) - Initial/Assessment Note    Patient Details  Name: Brandy Bowers MRN: [id number] Date of Birth: 11-May-1951  [facility]) CM/SW Contact:    Gabrielle Dare Phone Number: 05/14/2021, 11:46 AM  Clinical Narrative:                  CSW spoke with pt's sister Lambert Mody, [place] and HCPA for pt.  Pt has lived in [place], [place] since Jul 31, 2013.  Pt's spouse died in 31-Jul-2012 according to pt's sisiter Ivin Booty.  Pt lives alone.  Pt's sister stated " pt has received home health in home and neighbor assisting pt until surgery.  Pt's sister Ivin Booty prefers Public relations account executive in [place] for [employer]. Ivin Booty spoke with Olivia Mackie at [facility] in [place] for placement. Pt's sister stated "Olivia Mackie at [facility] at [place] said it will be better for pt to come on Monday instead of weekend. Pt's sister will transport pt to SNF. CSW will contact [facility] to confirm placement for rehab. Pt is not covid vaccinated. TOC will continue to assist for disposition planning.  Expected Discharge Plan: Skilled Nursing Facility Barriers to Discharge: Ship broker, Continued Medical Work up, SNF Pending bed offer   Patient Goals and CMS Choice Patient states their goals for this hospitalization and ongoing recovery are:: To be able to ambulate without pain CMS Medicare.gov Compare Post Acute Care list provided to:: Other (Comment Required) (medicare.gov website information was given to sister) Choice offered to / list presented to : Sibling  Expected Discharge Plan and Services Expected Discharge Plan: [place] In-house Referral: Clinical Social Work     Living arrangements for the past 2 months: Apartment                                      Prior Living Arrangements/Services Living arrangements for the past 2 months: Apartment Lives with:: Self Patient language and need for interpreter reviewed:: Yes Do you feel safe going back to the place where you live?:  Yes        Care giver support system in place?: Yes (comment)   Criminal Activity/Legal Involvement Pertinent to Current Situation/Hospitalization: No - Comment as needed  Activities of Daily Living Home Assistive Devices/Equipment: Eyeglasses, Cane (specify quad or straight), Dentures (specify type) ADL Screening (condition at time of admission) Patient's cognitive ability adequate to safely complete daily activities?: Yes Is the patient deaf or have difficulty hearing?: No Does the patient have difficulty seeing, even when wearing glasses/contacts?: No Does the patient have difficulty concentrating, remembering, or making decisions?: Yes Patient able to express need for assistance with ADLs?: Yes Does the patient have difficulty dressing or bathing?: Yes Independently performs ADLs?: No Communication: Independent Dressing (OT): Needs assistance Is this a change from baseline?: Pre-admission baseline Grooming: Needs assistance Feeding: Independent Bathing: Needs assistance Is this a change from baseline?: Pre-admission baseline Toileting: Independent In/Out Bed: Independent with device (comment) Is this a change from baseline?: Pre-admission baseline Walks in Home: Independent with device (comment) Does the patient have difficulty walking or climbing stairs?: Yes Weakness of Legs: Left Weakness of Arms/Hands: None  Permission Sought/Granted Permission sought to share information with : Facility Sport and exercise psychologist, Case Manager, Family Supports Permission granted to share information with : Yes, Verbal Permission Granted  Share Information with NAME: Lambert Mody  Permission granted to share info w AGENCY: SNF  Permission granted to  share info w Relationship: Lambert Mody, Sister  Permission granted to share info w Contact Information: Yes  Emotional Assessment Appearance:: Appears stated age Attitude/Demeanor/Rapport: Unable to Assess (Oriented to self only) Affect  (typically observed): Unable to Assess (oriented to self only) Orientation: : Oriented to Self Alcohol / Substance Use: Not Applicable Psych Involvement: No (comment)  Admission diagnosis:  Localized osteoarthritis of left knee [M17.12] Patient Active Problem List   Diagnosis Date Noted   Localized osteoarthritis of left knee 05/13/2021   PCP:  Street, Sharon Mt, MD Pharmacy:  No Pharmacies Listed    Social Determinants of Health (SDOH) Interventions    Readmission Risk Interventions No flowsheet data found.

## 2021-05-14 NOTE — Progress Notes (Signed)
° ° ° °  Subjective:  Patient doing well this morning pain well controlled. Worked well with PT yesterday. Ambulated 30 feet and has also been getting up with nursing overnight and going to the bathroom. Some confusion overnight and pulled out foley catheter but she is alert and oriented this morning. Plan for DC to SNF given patient's deconditioning and limited support at home.  Objective:   VITALS:   Vitals:   05/13/21 1401 05/13/21 1420 05/13/21 2108 05/14/21 0527  BP: (!) 151/68 (!) 149/77 (!) 114/57 (!) 115/58  Pulse: 81 81 78 82  Resp: 20 15 17 18   Temp: 97.7 F (36.5 C) 97.6 F (36.4 C) 98.3 F (36.8 C) 98 F (36.7 C)  TempSrc:  Oral Oral Oral  SpO2: 100% 100% 96% 98%  Weight:      Height:        Sensation intact distally Intact pulses distally Dorsiflexion/Plantar flexion intact Incision: dressing C/D/I Compartment soft   Lab Results  Component Value Date   WBC 14.2 (H) 05/14/2021   HGB 10.3 (L) 05/14/2021   HCT 29.9 (L) 05/14/2021   MCV 92.6 05/14/2021   PLT 262 05/14/2021   BMET    Component Value Date/Time   NA 130 (L) 05/14/2021 0056   K 4.3 05/14/2021 0056   CL 98 05/14/2021 0056   CO2 24 05/14/2021 0056   GLUCOSE 146 (H) 05/14/2021 0056   BUN 17 05/14/2021 0056   CREATININE 1.02 (H) 05/14/2021 0056   CALCIUM 8.8 (L) 05/14/2021 0056   GFRNONAA 60 (L) 05/14/2021 0056     Assessment/Plan: 1 Day Post-Op   Principal Problem:   Localized osteoarthritis of left knee S/p L TKA 12/30      Post op recs: WB: WBAT LLE Abx: vanco and cipro x23 hours post op (given hx of SOB with penicillin abx) extended abx with PO with bactrim x7 days (increased infection risk given hx of lymphedema) Imaging: PACU xrays DVT prophylaxis: Xarelto 10mg  daily x4 weeks starting POD1 Follow up: 2 weeks after surgery for a wound check with Dr. Zachery Dakins at [facility].  Address: [address] [place], [place], [place] [address]  Office Phone: [phone]    Willaim Sheng 05/14/2021, 6:53 AM   Charlies Constable, MD  Contact information:   [phone] 7am-5pm epic message Dr. Zachery Dakins, or call office for patient follow up: [phone] After hours and holidays please check [email or web address] for group call information for [facility]

## 2021-05-15 LAB — BASIC METABOLIC PANEL
Anion gap: 9 (ref 5–15)
BUN: 23 mg/dL (ref 8–23)
CO2: 25 mmol/L (ref 22–32)
Calcium: 8.4 mg/dL — ABNORMAL LOW (ref 8.9–10.3)
Chloride: 96 mmol/L — ABNORMAL LOW (ref 98–111)
Creatinine, Ser: 1.27 mg/dL — ABNORMAL HIGH (ref 0.44–1.00)
GFR, Estimated: 46 mL/min — ABNORMAL LOW (ref >60)
Glucose, Bld: 96 mg/dL (ref 70–99)
Potassium: 4.2 mmol/L (ref 3.5–5.1)
Sodium: 130 mmol/L — ABNORMAL LOW (ref 135–145)

## 2021-05-15 LAB — CBC
HCT: 25.4 % — ABNORMAL LOW (ref 36.0–46.0)
Hemoglobin: 8.8 g/dL — ABNORMAL LOW (ref 12.0–15.0)
MCH: 32.2 pg (ref 26.0–34.0)
MCHC: 34.6 g/dL (ref 30.0–36.0)
MCV: 93 fL (ref 80.0–100.0)
Platelets: 198 10*3/uL (ref 150–400)
RBC: 2.73 MIL/uL — ABNORMAL LOW (ref 3.87–5.11)
RDW: 13.7 % (ref 11.5–15.5)
WBC: 10.6 10*3/uL — ABNORMAL HIGH (ref 4.0–10.5)
nRBC: 0 % (ref 0.0–0.2)

## 2021-05-15 MED ORDER — OLANZAPINE 5 MG PO TABS
2.5000 mg | ORAL_TABLET | Freq: Once | ORAL | Status: AC
  Administered 2021-05-15: 2.5 mg via ORAL
  Filled 2021-05-15: qty 1

## 2021-05-15 MED ORDER — MELATONIN 3 MG PO TABS
3.0000 mg | ORAL_TABLET | Freq: Every day | ORAL | Status: DC
  Administered 2021-05-15: 3 mg via ORAL
  Filled 2021-05-15: qty 1

## 2021-05-15 MED ORDER — SODIUM CHLORIDE 0.9 % IV SOLN: INTRAVENOUS | Status: AC

## 2021-05-15 MED ORDER — OXYCODONE HCL 5 MG PO TABS
2.5000 mg | ORAL_TABLET | Freq: Four times a day (QID) | ORAL | Status: DC | PRN
  Administered 2021-05-15 – 2021-05-16 (×4): 5 mg via ORAL
  Filled 2021-05-15 (×4): qty 1

## 2021-05-15 MED ORDER — CLINDAMYCIN HCL 300 MG PO CAPS
300.0000 mg | ORAL_CAPSULE | Freq: Three times a day (TID) | ORAL | Status: DC
  Administered 2021-05-15 – 2021-05-16 (×5): 300 mg via ORAL
  Filled 2021-05-15 (×5): qty 1

## 2021-05-15 NOTE — Progress Notes (Signed)
Mobility Specialist Progress Note:   05/15/21 1050  Mobility  Activity Transferred:  Bed to chair  Level of Assistance Contact guard assist, steadying assist  Assistive Device Front wheel walker  LLE Weight Bearing WBAT  Distance Ambulated (ft) 4 ft  Mobility Sit up in bed/chair position for meals  Mobility Response Tolerated well  Mobility performed by Mobility specialist  Bed Position Chair  $Mobility charge 1 Mobility   Pt with confusion this am, no lethargy noted. Received yelling out for help to get into chair. Pt required verbal cues for hand placement, and proximity to RW. Pt up in chair with all needs met.  Nelta Numbers Mobility Specialist  Phone [phone]

## 2021-05-15 NOTE — Progress Notes (Addendum)
° ° ° °  Subjective:  Patient doing well this morning pain well controlled. Moderately confused this morning to place and situation. Has been mobilizing to bathroom. Plan for DC to SNF given patient's deconditioning and limited support at home as early as Monday.  Objective:   VITALS:   Vitals:   05/14/21 1312 05/14/21 1943 05/15/21 0408 05/15/21 0734  BP: (!) 106/45 (!) 130/54 (!) 102/52 (!) 91/57  Pulse: 73 83 80 88  Resp: 17 17 16 17   Temp: 97.8 F (36.6 C) 98.5 F (36.9 C) 98.1 F (36.7 C) 97.9 F (36.6 C)  TempSrc: Oral Oral Oral Oral  SpO2: 96% 100% 100% 100%  Weight:      Height:        Sensation intact distally Intact pulses distally Dorsiflexion/Plantar flexion intact Incision: dressing C/D/I Compartment soft   Lab Results  Component Value Date   WBC 10.6 (H) 05/15/2021   HGB 8.8 (L) 05/15/2021   HCT 25.4 (L) 05/15/2021   MCV 93.0 05/15/2021   PLT 198 05/15/2021   BMET    Component Value Date/Time   NA 130 (L) 05/15/2021 0055   K 4.2 05/15/2021 0055   CL 96 (L) 05/15/2021 0055   CO2 25 05/15/2021 0055   GLUCOSE 96 05/15/2021 0055   BUN 23 05/15/2021 0055   CREATININE 1.27 (H) 05/15/2021 0055   CALCIUM 8.4 (L) 05/15/2021 0055   GFRNONAA 46 (L) 05/15/2021 0055     Assessment/Plan: 2 Days Post-Op   Principal Problem:   Localized osteoarthritis of left knee S/p L TKA 12/30  Mild AKI Cr increased to 1.27 today from 0.87 preop. Bactrim switched to clindamycin to avoid renal toxicity. IV fluids ordered for this morning. Avoid additional NSAIDs. Recheck tomorrow AM.  Post op Anemia: 8.8 this AM, asymptomatic. Continue to monitor. Recheck in AM. No transfusion unless hgb drops <7 or patient becomes sympotmatic.  Altered mental status: minimize narcotics, will dc IV narcotics. Tylenol and low dose oxycodone 2.5-5mg  q6 available for post op pain.   Post op recs: WB: WBAT LLE Abx: vanco and cipro x23 hours post op (given hx of SOB with penicillin abx)  extended abx with PO with bactrim x7 days ->switched to clindamycin 300mg  q8 on 1/1 given increasing Cr (increased infection risk given hx of lymphedema) Imaging: PACU xrays DVT prophylaxis: Xarelto 10mg  daily x4 weeks starting POD1 Follow up: 2 weeks after surgery for a wound check with Dr. Zachery Dakins at [facility].  Address: [address] [place], [place], [place] [address]  Office Phone: [phone]    Willaim Sheng 05/15/2021, 7:54 AM   Charlies Constable, MD  Contact information:   [phone] 7am-5pm epic message Dr. Zachery Dakins, or call office for patient follow up: [phone] After hours and holidays please check [email or web address] for group call information for [facility]

## 2021-05-15 NOTE — Progress Notes (Signed)
Patient is confused,agitated and anxiety. MD Quillian Quince made awere. Given  medicine for pain. Given patein's update to the patient's sister Lambert Mody via phone.

## 2021-05-15 NOTE — Progress Notes (Signed)
Physical Therapy Treatment Patient Details Name: Brandy Bowers MRN: [id number] DOB: 12/25/1950 Today's Date: 05/15/2021   History of Present Illness 71 yo female s/p L TKR on 12/30. PMH includes AVR, dementia.    PT Comments    Patient progressing slowly towards PT goals. Pt confused and disoriented today repeating self frequently despite being told answers to questions. Difficulty with sequencing, problem solving and following multi step commands. Still lethargic today but improved from prior session. Requires Mod A for transfers and Min A for gait training with use of RW for support. Knee AROM 10-70 degrees. Reviewed there ex. Continues to be appropriate for SNF. Will follow.   Recommendations for follow up therapy are one component of a multi-disciplinary discharge planning process, led by the attending physician.  Recommendations may be updated based on patient status, additional functional criteria and insurance authorization.  Follow Up Recommendations  Skilled nursing-short term rehab (<3 hours/day)     Assistance Recommended at Discharge Frequent or constant Supervision/Assistance  Equipment Recommendations  Rolling walker (2 wheels)    Recommendations for Other Services       Precautions / Restrictions Precautions Precautions: Fall Restrictions Weight Bearing Restrictions: Yes LLE Weight Bearing: Weight bearing as tolerated     Mobility  Bed Mobility Overal bed mobility: Needs Assistance Bed Mobility: Supine to Sit     Supine to sit: Mod assist;HOB elevated     General bed mobility comments: Step by step cues for sequencing, assist with LLE and trunk, attempting to stand with feet not on floor. Poor probvlem solving.    Transfers Overall transfer level: Needs assistance Equipment used: Rolling walker (2 wheels) Transfers: Sit to/from Stand Sit to Stand: Mod assist           General transfer comment: Difficulty problem solving how to stand, attempting to  stand without feet on floor, needs max cues for positioning/sequencing/hand placement/technique. Mod A to power to standing.    Ambulation/Gait Ambulation/Gait assistance: Min assist Gait Distance (Feet): 60 Feet Assistive device: Rolling walker (2 wheels) Gait Pattern/deviations: Step-through pattern;Decreased stride length;Antalgic Gait velocity: decr     General Gait Details: Slow, mildly unsteady gait with RW for support, assist with RW management/directional cues, "I maybe could walk better if I have something to hold onto," despite alrady holding onto RW.   Stairs             Wheelchair Mobility    Modified Rankin (Stroke Patients Only)       Balance Overall balance assessment: Needs assistance Sitting-balance support: Feet supported;No upper extremity supported Sitting balance-Leahy Scale: Fair     Standing balance support: During functional activity;Reliant on assistive device for balance Standing balance-Leahy Scale: Poor                              Cognition Arousal/Alertness: Lethargic;Suspect due to medications Behavior During Therapy: [facility] for tasks assessed/performed Overall Cognitive Status: History of cognitive impairments - at baseline                                 General Comments: Repeating self during session, "no one is telling me any info," asking repeatedly if sister is coming to take her home today despite being told she is going to SNF. Poor sequencing, difficuilty following multi step commands. Poor initiation. Difficulty with problem solving.        Exercises Total  Joint Exercises Ankle Circles/Pumps: AROM;Both;10 reps;Seated Goniometric ROM: 10-70 degrees knee AROM    General Comments        Pertinent Vitals/Pain Pain Assessment: Faces Faces Pain Scale: Hurts a little bit Pain Location: L knee with movement Pain Descriptors / Indicators: Sore;Operative site guarding;Guarding Pain Intervention(s):  Monitored during session;Premedicated before session;Limited activity within patient's tolerance;Repositioned    Home Living                          Prior Function            PT Goals (current goals can now be found in the care plan section) Progress towards PT goals: Progressing toward goals    Frequency    7X/week      PT Plan Current plan remains appropriate    Co-evaluation              AM-PAC PT "6 Clicks" Mobility   Outcome Measure  Help needed turning from your back to your side while in a flat bed without using bedrails?: A Little Help needed moving from lying on your back to sitting on the side of a flat bed without using bedrails?: A Lot Help needed moving to and from a bed to a chair (including a wheelchair)?: A Lot Help needed standing up from a chair using your arms (e.g., wheelchair or bedside chair)?: A Lot Help needed to walk in hospital room?: A Little Help needed climbing 3-5 steps with a railing? : A Lot 6 Click Score: 14    End of Session Equipment Utilized During Treatment: Gait belt Activity Tolerance: Patient limited by lethargy Patient left: in chair;with call bell/phone within reach;with chair alarm set Nurse Communication: Mobility status PT Visit Diagnosis: Other abnormalities of gait and mobility (R26.89);Difficulty in walking, not elsewhere classified (R26.2);Pain Pain - Right/Left: Left Pain - part of body: Knee     Time: 5188-4166 PT Time Calculation (min) (ACUTE ONLY): 23 min  Charges:  $Gait Training: 8-22 mins $Therapeutic Activity: 8-22 mins                     Marisa Severin, PT, DPT Acute Rehabilitation Services Pager [phone] Office [place] 05/15/2021, 8:21 AM

## 2021-05-16 DIAGNOSIS — M1712 Unilateral primary osteoarthritis, left knee: Secondary | ICD-10-CM | POA: Diagnosis not present

## 2021-05-16 DIAGNOSIS — E079 Disorder of thyroid, unspecified: Secondary | ICD-10-CM | POA: Diagnosis not present

## 2021-05-16 DIAGNOSIS — G8918 Other acute postprocedural pain: Secondary | ICD-10-CM | POA: Diagnosis not present

## 2021-05-16 DIAGNOSIS — Z48812 Encounter for surgical aftercare following surgery on the circulatory system: Secondary | ICD-10-CM | POA: Diagnosis not present

## 2021-05-16 DIAGNOSIS — Z741 Need for assistance with personal care: Secondary | ICD-10-CM | POA: Diagnosis not present

## 2021-05-16 DIAGNOSIS — R2681 Unsteadiness on feet: Secondary | ICD-10-CM | POA: Diagnosis not present

## 2021-05-16 DIAGNOSIS — R41841 Cognitive communication deficit: Secondary | ICD-10-CM | POA: Diagnosis not present

## 2021-05-16 DIAGNOSIS — R262 Difficulty in walking, not elsewhere classified: Secondary | ICD-10-CM | POA: Diagnosis not present

## 2021-05-16 DIAGNOSIS — I119 Hypertensive heart disease without heart failure: Secondary | ICD-10-CM | POA: Diagnosis not present

## 2021-05-16 DIAGNOSIS — D519 Vitamin B12 deficiency anemia, unspecified: Secondary | ICD-10-CM | POA: Diagnosis not present

## 2021-05-16 DIAGNOSIS — D649 Anemia, unspecified: Secondary | ICD-10-CM | POA: Diagnosis not present

## 2021-05-16 DIAGNOSIS — M6281 Muscle weakness (generalized): Secondary | ICD-10-CM | POA: Diagnosis not present

## 2021-05-16 DIAGNOSIS — I350 Nonrheumatic aortic (valve) stenosis: Secondary | ICD-10-CM | POA: Diagnosis not present

## 2021-05-16 DIAGNOSIS — Z96652 Presence of left artificial knee joint: Secondary | ICD-10-CM | POA: Diagnosis not present

## 2021-05-16 DIAGNOSIS — C73 Malignant neoplasm of thyroid gland: Secondary | ICD-10-CM | POA: Diagnosis not present

## 2021-05-16 DIAGNOSIS — I2510 Atherosclerotic heart disease of native coronary artery without angina pectoris: Secondary | ICD-10-CM | POA: Diagnosis not present

## 2021-05-16 DIAGNOSIS — R54 Age-related physical debility: Secondary | ICD-10-CM | POA: Diagnosis not present

## 2021-05-16 DIAGNOSIS — E785 Hyperlipidemia, unspecified: Secondary | ICD-10-CM | POA: Diagnosis not present

## 2021-05-16 DIAGNOSIS — T8172XD Complication of vein following a procedure, not elsewhere classified, subsequent encounter: Secondary | ICD-10-CM | POA: Diagnosis not present

## 2021-05-16 DIAGNOSIS — J449 Chronic obstructive pulmonary disease, unspecified: Secondary | ICD-10-CM | POA: Diagnosis not present

## 2021-05-16 DIAGNOSIS — Z7401 Bed confinement status: Secondary | ICD-10-CM | POA: Diagnosis not present

## 2021-05-16 DIAGNOSIS — F0390 Unspecified dementia without behavioral disturbance: Secondary | ICD-10-CM | POA: Diagnosis not present

## 2021-05-16 DIAGNOSIS — I10 Essential (primary) hypertension: Secondary | ICD-10-CM | POA: Diagnosis not present

## 2021-05-16 DIAGNOSIS — R4182 Altered mental status, unspecified: Secondary | ICD-10-CM | POA: Diagnosis not present

## 2021-05-16 DIAGNOSIS — Z79899 Other long term (current) drug therapy: Secondary | ICD-10-CM | POA: Diagnosis not present

## 2021-05-16 DIAGNOSIS — R279 Unspecified lack of coordination: Secondary | ICD-10-CM | POA: Diagnosis not present

## 2021-05-16 DIAGNOSIS — I4891 Unspecified atrial fibrillation: Secondary | ICD-10-CM | POA: Diagnosis not present

## 2021-05-16 DIAGNOSIS — R0602 Shortness of breath: Secondary | ICD-10-CM | POA: Diagnosis not present

## 2021-05-16 DIAGNOSIS — F03B0 Unspecified dementia, moderate, without behavioral disturbance, psychotic disturbance, mood disturbance, and anxiety: Secondary | ICD-10-CM | POA: Diagnosis not present

## 2021-05-16 DIAGNOSIS — Z951 Presence of aortocoronary bypass graft: Secondary | ICD-10-CM | POA: Diagnosis not present

## 2021-05-16 DIAGNOSIS — F0280 Dementia in other diseases classified elsewhere without behavioral disturbance: Secondary | ICD-10-CM | POA: Diagnosis not present

## 2021-05-16 DIAGNOSIS — R2689 Other abnormalities of gait and mobility: Secondary | ICD-10-CM | POA: Diagnosis not present

## 2021-05-16 DIAGNOSIS — M810 Age-related osteoporosis without current pathological fracture: Secondary | ICD-10-CM | POA: Diagnosis not present

## 2021-05-16 DIAGNOSIS — F5101 Primary insomnia: Secondary | ICD-10-CM | POA: Diagnosis not present

## 2021-05-16 DIAGNOSIS — M25562 Pain in left knee: Secondary | ICD-10-CM | POA: Diagnosis not present

## 2021-05-16 DIAGNOSIS — Z471 Aftercare following joint replacement surgery: Secondary | ICD-10-CM | POA: Diagnosis not present

## 2021-05-16 DIAGNOSIS — G309 Alzheimer's disease, unspecified: Secondary | ICD-10-CM | POA: Diagnosis not present

## 2021-05-16 LAB — BASIC METABOLIC PANEL
Anion gap: 8 (ref 5–15)
BUN: 16 mg/dL (ref 8–23)
CO2: 25 mmol/L (ref 22–32)
Calcium: 8 mg/dL — ABNORMAL LOW (ref 8.9–10.3)
Chloride: 98 mmol/L (ref 98–111)
Creatinine, Ser: 1.05 mg/dL — ABNORMAL HIGH (ref 0.44–1.00)
GFR, Estimated: 58 mL/min — ABNORMAL LOW (ref >60)
Glucose, Bld: 96 mg/dL (ref 70–99)
Potassium: 4.1 mmol/L (ref 3.5–5.1)
Sodium: 131 mmol/L — ABNORMAL LOW (ref 135–145)

## 2021-05-16 LAB — CBC
HCT: 22.1 % — ABNORMAL LOW (ref 36.0–46.0)
Hemoglobin: 7.6 g/dL — ABNORMAL LOW (ref 12.0–15.0)
MCH: 31.9 pg (ref 26.0–34.0)
MCHC: 34.4 g/dL (ref 30.0–36.0)
MCV: 92.9 fL (ref 80.0–100.0)
Platelets: 201 10*3/uL (ref 150–400)
RBC: 2.38 MIL/uL — ABNORMAL LOW (ref 3.87–5.11)
RDW: 13.8 % (ref 11.5–15.5)
WBC: 9.5 10*3/uL (ref 4.0–10.5)
nRBC: 0 % (ref 0.0–0.2)

## 2021-05-16 MED ORDER — RIVAROXABAN 10 MG PO TABS: 10.0000 mg | ORAL_TABLET | Freq: Every day | ORAL | 0 refills | Status: AC

## 2021-05-16 MED ORDER — VITAMIN D3 25 MCG PO TABS: 1000.0000 [IU] | ORAL_TABLET | Freq: Every day | ORAL | Status: AC

## 2021-05-16 MED ORDER — OXYCODONE HCL 5 MG PO TABS: 2.5000 mg | ORAL_TABLET | Freq: Four times a day (QID) | ORAL | 0 refills | Status: AC | PRN

## 2021-05-16 MED ORDER — VITAMIN D 25 MCG (1000 UNIT) PO TABS
1000.0000 [IU] | ORAL_TABLET | Freq: Every day | ORAL | Status: DC
  Administered 2021-05-16: 1000 [IU] via ORAL
  Filled 2021-05-16: qty 1

## 2021-05-16 MED ORDER — ACETAMINOPHEN 325 MG PO TABS: 325.0000 mg | ORAL_TABLET | Freq: Four times a day (QID) | ORAL | 0 refills | Status: AC | PRN

## 2021-05-16 MED ORDER — ACETAMINOPHEN 500 MG PO TABS
1000.0000 mg | ORAL_TABLET | Freq: Three times a day (TID) | ORAL | Status: DC
  Administered 2021-05-16: 1000 mg via ORAL
  Filled 2021-05-16: qty 2

## 2021-05-16 MED ORDER — CALCIUM CARBONATE ANTACID 500 MG PO CHEW
1.0000 | CHEWABLE_TABLET | Freq: Every day | ORAL | Status: DC
  Administered 2021-05-16: 200 mg via ORAL
  Filled 2021-05-16: qty 1

## 2021-05-16 MED ORDER — ACETAMINOPHEN 500 MG PO TABS: 1000.0000 mg | ORAL_TABLET | Freq: Three times a day (TID) | ORAL | 0 refills | Status: AC

## 2021-05-16 MED ORDER — MELATONIN 3 MG PO TABS: 3.0000 mg | ORAL_TABLET | Freq: Every day | ORAL | 0 refills | Status: AC

## 2021-05-16 MED ORDER — CLINDAMYCIN HCL 300 MG PO CAPS: 300.0000 mg | ORAL_CAPSULE | Freq: Three times a day (TID) | ORAL | 0 refills | Status: AC

## 2021-05-16 NOTE — Care Management Important Message (Signed)
Important Message  Patient Details  Name: Brandy Bowers MRN: [id number] Date of Birth: 1951/03/05   Medicare Important Message Given:  Yes     Hannah Beat 05/16/2021, 12:52 PM

## 2021-05-16 NOTE — Discharge Instructions (Addendum)
INSTRUCTIONS AFTER JOINT REPLACEMENT  ° °Remove items at home which could result in a fall. This includes throw rugs or furniture in walking pathways °ICE to the affected joint every three hours while awake for 30 minutes at a time, for at least the first 3-5 days, and then as needed for pain and swelling.  Continue to use ice for pain and swelling. You may notice swelling that will progress down to the foot and ankle.  This is normal after surgery.  Elevate your leg when you are not up walking on it.   °Continue to use the breathing machine you got in the hospital (incentive spirometer) which will help keep your temperature down.  It is common for your temperature to cycle up and down following surgery, especially at night when you are not up moving around and exerting yourself.  The breathing machine keeps your lungs expanded and your temperature down. ° ° °DIET:  As you were doing prior to hospitalization, we recommend a well-balanced diet. ° °DRESSING / WOUND CARE / SHOWERING ° °Keep the surgical dressing until follow up.  The dressing is water proof, so you can shower without any extra covering.  IF THE DRESSING FALLS OFF or the wound gets wet inside, change the dressing with sterile gauze.  Please use good hand washing techniques before changing the dressing.  Do not use any lotions or creams on the incision until instructed by your surgeon.   ° °ACTIVITY ° °Increase activity slowly as tolerated, but follow the weight bearing instructions below.   °No driving for 6 weeks or until further direction given by your physician.  You cannot drive while taking narcotics.  °No lifting or carrying greater than 10 lbs. until further directed by your surgeon. °Avoid periods of inactivity such as sitting longer than an hour when not asleep. This helps prevent blood clots.  °You may return to work once you are authorized by your doctor.  ° ° ° °WEIGHT BEARING  ° °Weight bearing as tolerated with assist device (walker, cane,  etc) as directed, use it as long as suggested by your surgeon or therapist, typically at least 4-6 weeks. ° ° °EXERCISES ° °Results after joint replacement surgery are often greatly improved when you follow the exercise, range of motion and muscle strengthening exercises prescribed by your doctor. Safety measures are also important to protect the joint from further injury. Any time any of these exercises cause you to have increased pain or swelling, decrease what you are doing until you are comfortable again and then slowly increase them. If you have problems or questions, call your caregiver or physical therapist for advice.  ° °Rehabilitation is important following a joint replacement. After just a few days of immobilization, the muscles of the leg can become weakened and shrink (atrophy).  These exercises are designed to build up the tone and strength of the thigh and leg muscles and to improve motion. Often times heat used for twenty to thirty minutes before working out will loosen up your tissues and help with improving the range of motion but do not use heat for the first two weeks following surgery (sometimes heat can increase post-operative swelling).  ° °These exercises can be done on a training (exercise) mat, on the floor, on a table or on a bed. Use whatever works the best and is most comfortable for you.    Use music or television while you are exercising so that the exercises are a pleasant break in your   day. This will make your life better with the exercises acting as a break in your routine that you can look forward to.   Perform all exercises about fifteen times, three times per day or as directed.  You should exercise both the operative leg and the other leg as well.  Exercises include:   Quad Sets - Tighten up the muscle on the front of the thigh (Quad) and hold for 5-10 seconds.   Straight Leg Raises - With your knee straight (if you were given a brace, keep it on), lift the leg to 60  degrees, hold for 3 seconds, and slowly lower the leg.  Perform this exercise against resistance later as your leg gets stronger.  Leg Slides: Lying on your back, slowly slide your foot toward your buttocks, bending your knee up off the floor (only go as far as is comfortable). Then slowly slide your foot back down until your leg is flat on the floor again.  Angel Wings: Lying on your back spread your legs to the side as far apart as you can without causing discomfort.  Hamstring Strength:  Lying on your back, push your heel against the floor with your leg straight by tightening up the muscles of your buttocks.  Repeat, but this time bend your knee to a comfortable angle, and push your heel against the floor.  You may put a pillow under the heel to make it more comfortable if necessary.   A rehabilitation program following joint replacement surgery can speed recovery and prevent re-injury in the future due to weakened muscles. Contact your doctor or a physical therapist for more information on knee rehabilitation.    CONSTIPATION  Constipation is defined medically as fewer than three stools per week and severe constipation as less than one stool per week.  Even if you have a regular bowel pattern at home, your normal regimen is likely to be disrupted due to multiple reasons following surgery.  Combination of anesthesia, postoperative narcotics, change in appetite and fluid intake all can affect your bowels.   YOU MUST use at least one of the following options; they are listed in order of increasing strength to get the job done.  They are all available over the counter, and you may need to use some, POSSIBLY even all of these options:    Drink plenty of fluids (prune juice may be helpful) and high fiber foods Colace 100 mg by mouth twice a day  Senokot for constipation as directed and as needed Dulcolax (bisacodyl), take with full glass of water  Miralax (polyethylene glycol) once or twice a day as  needed.  If you have tried all these things and are unable to have a bowel movement in the first 3-4 days after surgery call either your surgeon or your primary doctor.    If you experience loose stools or diarrhea, hold the medications until you stool forms back up.  If your symptoms do not get better within 1 week or if they get worse, check with your doctor.  If you experience "the worst abdominal pain ever" or develop nausea or vomiting, please contact the office immediately for further recommendations for treatment.   ITCHING:  If you experience itching with your medications, try taking only a single pain pill, or even half a pain pill at a time.  You can also use Benadryl over the counter for itching or also to help with sleep.   TED HOSE STOCKINGS:  Use stockings on both  legs until for at least 2 weeks or as directed by physician office. They may be removed at night for sleeping.  MEDICATIONS:  See your medication summary on the After Visit Summary that nursing will review with you.  You may have some home medications which will be placed on hold until you complete the course of blood thinner medication.  It is important for you to complete the blood thinner medication as prescribed.   Blood clot prevention (DVT Prophylaxis): After surgery you are at an increased risk for a blood clot. you were prescribed a blood thinner, Xarelto 10mg , to be taken once daily for a total of 4 weeks from surgery to help reduce your risk of getting a blood clot. This will help prevent a blood clot. Signs of a pulmonary embolus (blood clot in the lungs) include sudden short of breath, feeling lightheaded or dizzy, chest pain with a deep breath, rapid pulse rapid breathing. Signs of a blood clot in your arms or legs include new unexplained swelling and cramping, warm, red or darkened skin around the painful area. Please call the office or 911 right away if these signs or symptoms develop.  PRECAUTIONS:  If you  experience chest pain or shortness of breath - call 911 immediately for transfer to the hospital emergency department.   If you develop a fever greater that 101 F, purulent drainage from wound, increased redness or drainage from wound, foul odor from the wound/dressing, or calf pain - CONTACT YOUR SURGEON.                                                   FOLLOW-UP APPOINTMENTS:  If you do not already have a post-op appointment, please call the office for an appointment to be seen by your surgeon.  Guidelines for how soon to be seen are listed in your After Visit Summary, but are typically between 1-4 weeks after surgery.  OTHER INSTRUCTIONS:   Knee Replacement:  Do not place pillow under knee, focus on keeping the knee straight while resting. CPM instructions: 0-90 degrees, 2 hours in the morning, 2 hours in the afternoon, and 2 hours in the evening. Place foam block, curve side up under heel at all times except when in CPM or when walking.  DO NOT modify, tear, cut, or change the foam block in any way.  POST-OPERATIVE OPIOID TAPER INSTRUCTIONS: It is important to wean off of your opioid medication as soon as possible. If you do not need pain medication after your surgery it is ok to stop day one. Opioids include: Codeine, Hydrocodone(Norco, Vicodin), Oxycodone(Percocet, oxycontin) and hydromorphone amongst others.  Long term and even short term use of opiods can cause: Increased pain response Dependence Constipation Depression Respiratory depression And more.  Withdrawal symptoms can include Flu like symptoms Nausea, vomiting And more Techniques to manage these symptoms Hydrate well Eat regular healthy meals Stay active Use relaxation techniques(deep breathing, meditating, yoga) Do Not substitute Alcohol to help with tapering If you have been on opioids for less than two weeks and do not have pain than it is ok to stop all together.  Plan to wean off of opioids This plan should  start within one week post op of your joint replacement. Maintain the same interval or time between taking each dose and first decrease the dose.  Cut the  total daily intake of opioids by one tablet each day Next start to increase the time between doses. The last dose that should be eliminated is the evening dose.   MAKE SURE YOU:  Understand these instructions.  Get help right away if you are not doing well or get worse.    Thank you for letting us be a part of your medical care team.  It is a privilege we respect greatly.  We hope these instructions will help you stay on track for a fast and full recovery!   ======================================================  Information on my medicine - XARELTO (Rivaroxaban)  This medication education was reviewed with me or my healthcare representative as part of my discharge preparation.     Why was Xarelto prescribed for you? Xarelto was prescribed for you to reduce the risk of blood clots forming after orthopedic surgery. The medical term for these abnormal blood clots is venous thromboembolism (VTE).  What do you need to know about xarelto ? Take your Xarelto ONCE DAILY at the same time every day. You may take it either with or without food.  If you have difficulty swallowing the tablet whole, you may crush it and mix in applesauce just prior to taking your dose.  Take Xarelto exactly as prescribed by your doctor and DO NOT stop taking Xarelto without talking to the doctor who prescribed the medication.  Stopping without other VTE prevention medication to take the place of Xarelto may increase your risk of developing a clot.  After discharge, you should have regular check-up appointments with your healthcare provider that is prescribing your Xarelto.    What do you do if you miss a dose? If you miss a dose, take it as soon as you remember on the same day then continue your regularly scheduled once daily regimen the next day. Do  not take two doses of Xarelto on the same day.   Important Safety Information A possible side effect of Xarelto is bleeding. You should call your healthcare provider right away if you experience any of the following: Bleeding from an injury or your nose that does not stop. Unusual colored urine (red or dark brown) or unusual colored stools (red or black). Unusual bruising for unknown reasons. A serious fall or if you hit your head (even if there is no bleeding).  Some medicines may interact with Xarelto and might increase your risk of bleeding while on Xarelto. To help avoid this, consult your healthcare provider or pharmacist prior to using any new prescription or non-prescription medications, including herbals, vitamins, non-steroidal anti-inflammatory drugs (NSAIDs) and supplements.  This website has more information on Xarelto: [email or web address]

## 2021-05-16 NOTE — Progress Notes (Signed)
Physical Therapy Treatment Patient Details Name: Brandy Bowers MRN: [id number] DOB: May 04, 1951 Today's Date: 05/16/2021   History of Present Illness 71 yo female s/p L TKR on 12/30. PMH includes AVR, dementia.    PT Comments    Patient progressing slowly towards PT goals. Continues to be confused today. Improved ambulation distance with min A and use of RW for support. Knee AROM ~10-70 degrees. Tolerated there ex sitting in chair. Requires Mod A for transfers and max cues for technique due to cognition. WIll follow.    Recommendations for follow up therapy are one component of a multi-disciplinary discharge planning process, led by the attending physician.  Recommendations may be updated based on patient status, additional functional criteria and insurance authorization.  Follow Up Recommendations  Skilled nursing-short term rehab (<3 hours/day)     Assistance Recommended at Discharge Frequent or constant Supervision/Assistance  Equipment Recommendations  Rolling walker (2 wheels)    Recommendations for Other Services       Precautions / Restrictions Precautions Precautions: Fall Restrictions Weight Bearing Restrictions: Yes LLE Weight Bearing: Weight bearing as tolerated     Mobility  Bed Mobility Overal bed mobility: Needs Assistance Bed Mobility: Supine to Sit     Supine to sit: Mod assist;HOB elevated     General bed mobility comments: Step by step cues for sequencing, assist with LLE and trunk. Poor probvlem solving.    Transfers Overall transfer level: Needs assistance Equipment used: Rolling walker (2 wheels) Transfers: Sit to/from Stand Sit to Stand: Mod assist           General transfer comment: Difficulty problem solving how to stand, attempting to stand without feet on floor, needs max cues for positioning/sequencing/hand placement/technique. Mod A to power to standing. transferred to chair.    Ambulation/Gait Ambulation/Gait assistance: Min  assist Gait Distance (Feet): 70 Feet Assistive device: Rolling walker (2 wheels) Gait Pattern/deviations: Step-through pattern;Decreased stride length;Knee flexed in stance - left Gait velocity: decr     General Gait Details: Slow, mildly unsteady gait with RW for support, assist with RW management. Cues for knee extension during stance, decreased knee flexion during swing.   Stairs             Wheelchair Mobility    Modified Rankin (Stroke Patients Only)       Balance Overall balance assessment: Needs assistance Sitting-balance support: Feet supported;No upper extremity supported Sitting balance-Leahy Scale: Fair     Standing balance support: During functional activity;Reliant on assistive device for balance Standing balance-Leahy Scale: Poor                              Cognition Arousal/Alertness: Awake/alert Behavior During Therapy: WFL for tasks assessed/performed Overall Cognitive Status: History of cognitive impairments - at baseline                                 General Comments: Continues to be confused, not sure where she is or why she is here. Difficulty with problem solving and intiation of movement despite verbal cues.        Exercises Total Joint Exercises Ankle Circles/Pumps: AROM;Both;10 reps;Seated Long Arc Quad: AAROM;Left;10 reps;Seated Knee Flexion: AROM;AAROM;Left;5 reps;Seated Goniometric ROM: ~10-70 degrees knee AROM    General Comments        Pertinent Vitals/Pain Pain Assessment: Faces Faces Pain Scale: Hurts even more Pain Location: L knee with  movement Pain Descriptors / Indicators: Sore;Operative site guarding;Guarding;Grimacing Pain Intervention(s): Monitored during session;Limited activity within patient's tolerance;Repositioned    Home Living                          Prior Function            PT Goals (current goals can now be found in the care plan section) Progress towards PT  goals: Progressing toward goals    Frequency    7X/week      PT Plan Current plan remains appropriate    Co-evaluation              AM-PAC PT "6 Clicks" Mobility   Outcome Measure  Help needed turning from your back to your side while in a flat bed without using bedrails?: A Little Help needed moving from lying on your back to sitting on the side of a flat bed without using bedrails?: A Lot Help needed moving to and from a bed to a chair (including a wheelchair)?: A Lot Help needed standing up from a chair using your arms (e.g., wheelchair or bedside chair)?: A Lot Help needed to walk in hospital room?: A Little Help needed climbing 3-5 steps with a railing? : A Lot 6 Click Score: 14    End of Session Equipment Utilized During Treatment: Gait belt Activity Tolerance: Patient tolerated treatment well Patient left: in chair;with call bell/phone within reach;with chair alarm set;with nursing/sitter in room Nurse Communication: Mobility status PT Visit Diagnosis: Other abnormalities of gait and mobility (R26.89);Difficulty in walking, not elsewhere classified (R26.2);Pain Pain - Right/Left: Left Pain - part of body: Knee     Time: 0807-0826 PT Time Calculation (min) (ACUTE ONLY): 19 min  Charges:  $Gait Training: 8-22 mins                     Marisa Severin, PT, DPT Acute Rehabilitation Services Pager [phone] Office [place] 05/16/2021, 9:40 AM

## 2021-05-16 NOTE — Discharge Summary (Addendum)
Physician Discharge Summary  Patient ID: Brandy Bowers MRN: [id number] DOB/AGE: 14-Apr-1951 71 y.o.  Admit date: 05/13/2021 Discharge date: 05/16/2021  Admission Diagnoses:  Localized osteoarthritis of left knee  Discharge Diagnoses:  Principal Problem:   Localized osteoarthritis of left knee   Past Medical History:  Diagnosis Date   Arthritis    Dementia ([place])    Early staages of dementia   Hyperlipemia    Hypertension    PONV (postoperative nausea and vomiting)     Surgeries: Procedure(s): TOTAL KNEE ARTHROPLASTY on 05/13/2021   Consultants (if any):   Discharged Condition: Improved  Hospital Course: Brandy Bowers is an 71 y.o. female who was admitted 05/13/2021 with a diagnosis of Localized osteoarthritis of left knee and went to the operating room on 05/13/2021 and underwent the above named procedures.  She did well post operatively and worked with physical therapy and discharge to skilled nursing facility was recommended.  She was given perioperative antibiotics:  Anti-infectives (From admission, onward)    Start     Dose/Rate Route Frequency Ordered Stop   05/16/21 0000  clindamycin (CLEOCIN) 300 MG capsule        300 mg Oral Every 8 hours 05/16/21 0753 05/21/21 2359   05/15/21 0845  clindamycin (CLEOCIN) capsule 300 mg        300 mg Oral Every 8 hours 05/15/21 0749 05/17/21 0559   05/14/21 2200  sulfamethoxazole-trimethoprim (BACTRIM DS) 800-160 MG per tablet 1 tablet  Status:  Discontinued        1 tablet Oral Every 12 hours 05/13/21 1547 05/14/21 0655   05/14/21 1000  sulfamethoxazole-trimethoprim (BACTRIM DS) 800-160 MG per tablet 1 tablet  Status:  Discontinued        1 tablet Oral Every 12 hours 05/14/21 0655 05/15/21 0749   05/13/21 2100  vancomycin (VANCOCIN) IVPB 1000 mg/200 mL premix        1,000 mg 200 mL/hr over 60 Minutes Intravenous Every 12 hours 05/13/21 1201 05/13/21 2129   05/13/21 2100  ciprofloxacin (CIPRO) IVPB 400 mg        400 mg 200 mL/hr  over 60 Minutes Intravenous Every 12 hours 05/13/21 1422 05/13/21 2128   05/13/21 0900  ciprofloxacin (CIPRO) IVPB 400 mg        400 mg 200 mL/hr over 60 Minutes Intravenous  Once 05/13/21 0851 05/13/21 0920   05/13/21 0815  vancomycin (VANCOCIN) IVPB 1000 mg/200 mL premix        1,000 mg 200 mL/hr over 60 Minutes Intravenous  Once 05/13/21 0805 05/13/21 0914     .  She was given sequential compression devices, early ambulation, and Xarelto for DVT prophylaxis.  She benefited maximally from the hospital stay and there were no complications.    Recent vital signs:  Vitals:   05/16/21 0642 05/16/21 0850  BP: 123/67 (!) 137/59  Pulse: 95 79  Resp: 20 19  Temp: 98.2 F (36.8 C) 98.2 F (36.8 C)  SpO2: 97% 96%    Recent laboratory studies:  Lab Results  Component Value Date   HGB 7.6 (L) 05/16/2021   HGB 8.8 (L) 05/15/2021   HGB 10.3 (L) 05/14/2021   Lab Results  Component Value Date   WBC 9.5 05/16/2021   PLT 201 05/16/2021   No results found for: INR Lab Results  Component Value Date   NA 131 (L) 05/16/2021   K 4.1 05/16/2021   CL 98 05/16/2021   CO2 25 05/16/2021   BUN 16 05/16/2021  CREATININE 1.05 (H) 05/16/2021   GLUCOSE 96 05/16/2021    Discharge Medications:   Allergies as of 05/16/2021       Reactions   Penicillins    Unknown reaction        Medication List     STOP taking these medications    sulfamethoxazole-trimethoprim 800-160 MG tablet Commonly known as: BACTRIM DS       TAKE these medications    acetaminophen 325 MG tablet Commonly known as: TYLENOL Take 1-2 tablets (325-650 mg total) by mouth every 6 (six) hours as needed for up to 14 days for mild pain or moderate pain (pain score 1-3 or temp > 100.5).   acetaminophen 500 MG tablet Commonly known as: TYLENOL Take 2 tablets (1,000 mg total) by mouth every 8 (eight) hours.   clindamycin 300 MG capsule Commonly known as: CLEOCIN Take 1 capsule (300 mg total) by mouth every 8  (eight) hours for 5 days.   melatonin 3 MG Tabs tablet Take 1 tablet (3 mg total) by mouth at bedtime for 27 days.   memantine 5 MG tablet Commonly known as: NAMENDA Take 1 tablet  (5 mg) twice a day What changed:  how much to take how to take this when to take this additional instructions   metolazone 2.5 MG tablet Commonly known as: ZAROXOLYN Take 1.25 mg by mouth every Monday. With an additional 10 mEq of Potassium   oxyCODONE 5 MG immediate release tablet Commonly known as: Oxy IR/ROXICODONE Take 0.5-1 tablets (2.5-5 mg total) by mouth every 6 (six) hours as needed for moderate pain or severe pain (pain score 4-6).   potassium chloride 10 MEQ CR capsule Commonly known as: MICRO-K Take 10 mEq by mouth See admin instructions. 10 mEq daily, except for Monday's take an additional 10 mEq with Metolazone.   rivaroxaban 10 MG Tabs tablet Commonly known as: XARELTO Take 1 tablet (10 mg total) by mouth daily with breakfast for 27 days.   rosuvastatin 10 MG tablet Commonly known as: CRESTOR Take 1 tablet by mouth daily.   telmisartan 20 MG tablet Commonly known as: MICARDIS Take 10 mg by mouth daily. Taking 10 mg daily starting on 04/28/21 What changed: Another medication with the same name was removed. Continue taking this medication, and follow the directions you see here.   torsemide 10 MG tablet Commonly known as: DEMADEX Take 10 mg by mouth daily.   Vitamin D3 25 MCG tablet Commonly known as: Vitamin D Take 1 tablet (1,000 Units total) by mouth daily.               Discharge Care Instructions  (From admission, onward)           Start     Ordered   05/16/21 0000  Change dressing       Comments: Keep aquacell dressing intact until follow up in clinic.   05/16/21 0753            Diagnostic Studies: DG Knee Left Port  Result Date: 05/13/2021 CLINICAL DATA:  Total left knee replacement. EXAM: PORTABLE LEFT KNEE - 1-2 VIEW COMPARISON:  None.  FINDINGS: Total left knee replacement is identified without malalignment. Postoperative changes including joint fluid and air are identified. IMPRESSION: Total left knee replacement without malalignment. Electronically Signed   By: Abelardo Diesel M.D.   On: 05/13/2021 14:23    Disposition: Discharge disposition: 03-Skilled Nursing Facility       Discharge Instructions     CPM  Complete by: As directed    Continuous passive motion machine (CPM):      Use the CPM from 0 to 90 for 2 hours per day.      You may increase by 5 degrees per day up to 90 degrees.  You may break it up into 2 or 3 sessions per day.      Use CPM for 4 weeks or until you are told to stop.   Call MD / Call 911   Complete by: As directed    If you experience chest pain or shortness of breath, CALL 911 and be transported to the hospital emergency room.  If you develope a fever above 101 F, pus (white drainage) or increased drainage or redness at the wound, or calf pain, call your surgeon's office.   Change dressing   Complete by: As directed    Keep aquacell dressing intact until follow up in clinic.   Constipation Prevention   Complete by: As directed    Drink plenty of fluids.  Prune juice may be helpful.  You may use a stool softener, such as Colace (over the counter) 100 mg twice a day.  Use MiraLax (over the counter) for constipation as needed.   Diet - low sodium heart healthy   Complete by: As directed    Do not put a pillow under the knee. Place it under the heel.   Complete by: As directed    Increase activity slowly as tolerated   Complete by: As directed    Post-operative opioid taper instructions:   Complete by: As directed    POST-OPERATIVE OPIOID TAPER INSTRUCTIONS: It is important to wean off of your opioid medication as soon as possible. If you do not need pain medication after your surgery it is ok to stop day one. Opioids include: Codeine, Hydrocodone(Norco, Vicodin), Oxycodone(Percocet,  oxycontin) and hydromorphone amongst others.  Long term and even short term use of opiods can cause: Increased pain response Dependence Constipation Depression Respiratory depression And more.  Withdrawal symptoms can include Flu like symptoms Nausea, vomiting And more Techniques to manage these symptoms Hydrate well Eat regular healthy meals Stay active Use relaxation techniques(deep breathing, meditating, yoga) Do Not substitute Alcohol to help with tapering If you have been on opioids for less than two weeks and do not have pain than it is ok to stop all together.  Plan to wean off of opioids This plan should start within one week post op of your joint replacement. Maintain the same interval or time between taking each dose and first decrease the dose.  Cut the total daily intake of opioids by one tablet each day Next start to increase the time between doses. The last dose that should be eliminated is the evening dose.      TED hose   Complete by: As directed    Use stockings (TED hose) for 4 weeks on left leg(s).  You may remove them at night for sleeping.      INSTRUCTIONS AFTER JOINT REPLACEMENT   Remove items at home which could result in a fall. This includes throw rugs or furniture in walking pathways ICE to the affected joint every three hours while awake for 30 minutes at a time, for at least the first 3-5 days, and then as needed for pain and swelling.  Continue to use ice for pain and swelling. You may notice swelling that will progress down to the foot and ankle.  This is normal after surgery.  Elevate your leg when you are not up walking on it.   Continue to use the breathing machine you got in the hospital (incentive spirometer) which will help keep your temperature down.  It is common for your temperature to cycle up and down following surgery, especially at night when you are not up moving around and exerting yourself.  The breathing machine keeps your lungs  expanded and your temperature down.   DIET:  As you were doing prior to hospitalization, we recommend a well-balanced diet.  DRESSING / WOUND CARE / SHOWERING  Keep the surgical dressing until follow up.  The dressing is water proof, so you can shower without any extra covering.  IF THE DRESSING FALLS OFF or the wound gets wet inside, change the dressing with sterile gauze.  Please use good hand washing techniques before changing the dressing.  Do not use any lotions or creams on the incision until instructed by your surgeon.    ACTIVITY  Increase activity slowly as tolerated, but follow the weight bearing instructions below.   No driving for 6 weeks or until further direction given by your physician.  You cannot drive while taking narcotics.  No lifting or carrying greater than 10 lbs. until further directed by your surgeon. Avoid periods of inactivity such as sitting longer than an hour when not asleep. This helps prevent blood clots.  You may return to work once you are authorized by your doctor.     WEIGHT BEARING   Weight bearing as tolerated with assist device (walker, cane, etc) as directed, use it as long as suggested by your surgeon or therapist, typically at least 4-6 weeks.   EXERCISES  Results after joint replacement surgery are often greatly improved when you follow the exercise, range of motion and muscle strengthening exercises prescribed by your doctor. Safety measures are also important to protect the joint from further injury. Any time any of these exercises cause you to have increased pain or swelling, decrease what you are doing until you are comfortable again and then slowly increase them. If you have problems or questions, call your caregiver or physical therapist for advice.   Rehabilitation is important following a joint replacement. After just a few days of immobilization, the muscles of the leg can become weakened and shrink (atrophy).  These exercises are  designed to build up the tone and strength of the thigh and leg muscles and to improve motion. Often times heat used for twenty to thirty minutes before working out will loosen up your tissues and help with improving the range of motion but do not use heat for the first two weeks following surgery (sometimes heat can increase post-operative swelling).   These exercises can be done on a training (exercise) mat, on the floor, on a table or on a bed. Use whatever works the best and is most comfortable for you.    Use music or television while you are exercising so that the exercises are a pleasant break in your day. This will make your life better with the exercises acting as a break in your routine that you can look forward to.   Perform all exercises about fifteen times, three times per day or as directed.  You should exercise both the operative leg and the other leg as well.  Exercises include:   Quad Sets - Tighten up the muscle on the front of the thigh (Quad) and hold for 5-10 seconds.   Straight Leg Raises - With your  knee straight (if you were given a brace, keep it on), lift the leg to 60 degrees, hold for 3 seconds, and slowly lower the leg.  Perform this exercise against resistance later as your leg gets stronger.  Leg Slides: Lying on your back, slowly slide your foot toward your buttocks, bending your knee up off the floor (only go as far as is comfortable). Then slowly slide your foot back down until your leg is flat on the floor again.  Angel Wings: Lying on your back spread your legs to the side as far apart as you can without causing discomfort.  Hamstring Strength:  Lying on your back, push your heel against the floor with your leg straight by tightening up the muscles of your buttocks.  Repeat, but this time bend your knee to a comfortable angle, and push your heel against the floor.  You may put a pillow under the heel to make it more comfortable if necessary.   A rehabilitation program  following joint replacement surgery can speed recovery and prevent re-injury in the future due to weakened muscles. Contact your doctor or a physical therapist for more information on knee rehabilitation.    CONSTIPATION  Constipation is defined medically as fewer than three stools per week and severe constipation as less than one stool per week.  Even if you have a regular bowel pattern at home, your normal regimen is likely to be disrupted due to multiple reasons following surgery.  Combination of anesthesia, postoperative narcotics, change in appetite and fluid intake all can affect your bowels.   YOU MUST use at least one of the following options; they are listed in order of increasing strength to get the job done.  They are all available over the counter, and you may need to use some, POSSIBLY even all of these options:    Drink plenty of fluids (prune juice may be helpful) and high fiber foods Colace 100 mg by mouth twice a day  Senokot for constipation as directed and as needed Dulcolax (bisacodyl), take with full glass of water  Miralax (polyethylene glycol) once or twice a day as needed.  If you have tried all these things and are unable to have a bowel movement in the first 3-4 days after surgery call either your surgeon or your primary doctor.    If you experience loose stools or diarrhea, hold the medications until you stool forms back up.  If your symptoms do not get better within 1 week or if they get worse, check with your doctor.  If you experience "the worst abdominal pain ever" or develop nausea or vomiting, please contact the office immediately for further recommendations for treatment.   ITCHING:  If you experience itching with your medications, try taking only a single pain pill, or even half a pain pill at a time.  You can also use Benadryl over the counter for itching or also to help with sleep.   TED HOSE STOCKINGS:  Use stockings on both legs until for at least 2 weeks  or as directed by physician office. They may be removed at night for sleeping.  MEDICATIONS:  See your medication summary on the After Visit Summary that nursing will review with you.  You may have some home medications which will be placed on hold until you complete the course of blood thinner medication.  It is important for you to complete the blood thinner medication as prescribed.   Blood clot prevention (DVT Prophylaxis): After surgery  you are at an increased risk for a blood clot. you were prescribed a blood thinner, Xarelto 10mg , to be taken once daily for a total of 4 weeks from surgery to help reduce your risk of getting a blood clot. This will help prevent a blood clot. Signs of a pulmonary embolus (blood clot in the lungs) include sudden short of breath, feeling lightheaded or dizzy, chest pain with a deep breath, rapid pulse rapid breathing. Signs of a blood clot in your arms or legs include new unexplained swelling and cramping, warm, red or darkened skin around the painful area. Please call the office or 911 right away if these signs or symptoms develop.  PRECAUTIONS:  If you experience chest pain or shortness of breath - call 911 immediately for transfer to the hospital emergency department.   If you develop a fever greater that 101 F, purulent drainage from wound, increased redness or drainage from wound, foul odor from the wound/dressing, or calf pain - CONTACT YOUR SURGEON.                                                   FOLLOW-UP APPOINTMENTS:  If you do not already have a post-op appointment, please call the office for an appointment to be seen by your surgeon.  Guidelines for how soon to be seen are listed in your After Visit Summary, but are typically between 1-4 weeks after surgery.  OTHER INSTRUCTIONS:   Knee Replacement:  Do not place pillow under knee, focus on keeping the knee straight while resting. CPM instructions: 0-90 degrees, 2 hours in the morning, 2 hours in  the afternoon, and 2 hours in the evening. Place foam block, curve side up under heel at all times except when in CPM or when walking.  DO NOT modify, tear, cut, or change the foam block in any way.  POST-OPERATIVE OPIOID TAPER INSTRUCTIONS: It is important to wean off of your opioid medication as soon as possible. If you do not need pain medication after your surgery it is ok to stop day one. Opioids include: Codeine, Hydrocodone(Norco, Vicodin), Oxycodone(Percocet, oxycontin) and hydromorphone amongst others.  Long term and even short term use of opiods can cause: Increased pain response Dependence Constipation Depression Respiratory depression And more.  Withdrawal symptoms can include Flu like symptoms Nausea, vomiting And more Techniques to manage these symptoms Hydrate well Eat regular healthy meals Stay active Use relaxation techniques(deep breathing, meditating, yoga) Do Not substitute Alcohol to help with tapering If you have been on opioids for less than two weeks and do not have pain than it is ok to stop all together.  Plan to wean off of opioids This plan should start within one week post op of your joint replacement. Maintain the same interval or time between taking each dose and first decrease the dose.  Cut the total daily intake of opioids by one tablet each day Next start to increase the time between doses. The last dose that should be eliminated is the evening dose.   MAKE SURE YOU:  Understand these instructions.  Get help right away if you are not doing well or get worse.    Thank you for letting us be a part of your medical care team.  It is a privilege we respect greatly.  We hope these instructions will help you stay  on track for a fast and full recovery!          Follow-up Information     Willaim Sheng, MD. Go on 05/31/2021   Specialty: Orthopedic Surgery Why: Your appointment has been scheduled for 11:15. Contact information: [address] [place] [address] [phone]                  Signed: [place] 05/16/2021, 11:34 AM

## 2021-05-16 NOTE — Progress Notes (Signed)
° ° ° °  Subjective:  Patient was anxious agitated yesterday. Zyprexa and melatonin given yesterday to help with sleep. This morning still a bit anxious but she is oriented to place, [place], and situation, s/p knee replacement. She worked well with PT yesterday ambulating 60 feet. This morning pain well controlled in the left knee.    Objective:   VITALS:   Vitals:   05/15/21 0734 05/15/21 1457 05/15/21 1900 05/16/21 0642  BP: (!) 91/57 (!) 115/46 (!) 131/59 123/67  Pulse: 88 96 100 95  Resp: 17 17 20 20   Temp: 97.9 F (36.6 C) (!) 97.5 F (36.4 C) 98.1 F (36.7 C) 98.2 F (36.8 C)  TempSrc: Oral Oral Axillary Oral  SpO2: 100% 97% 98% 97%  Weight:      Height:        Sensation intact distally Intact pulses distally Dorsiflexion/Plantar flexion intact Incision: dressing C/D/I Compartment soft   Lab Results  Component Value Date   WBC 9.5 05/16/2021   HGB 7.6 (L) 05/16/2021   HCT 22.1 (L) 05/16/2021   MCV 92.9 05/16/2021   PLT 201 05/16/2021   BMET    Component Value Date/Time   NA 131 (L) 05/16/2021 0337   K 4.1 05/16/2021 0337   CL 98 05/16/2021 0337   CO2 25 05/16/2021 0337   GLUCOSE 96 05/16/2021 0337   BUN 16 05/16/2021 0337   CREATININE 1.05 (H) 05/16/2021 0337   CALCIUM 8.0 (L) 05/16/2021 0337   GFRNONAA 58 (L) 05/16/2021 0337     Assessment/Plan: 3 Days Post-Op   Principal Problem:   Localized osteoarthritis of left knee S/p L TKA 12/30  Mild AKI Cr Improved today 1.27 - > 1.05. Bactrim switched to clindamycin to avoid renal toxicity. IV fluids given yesterday. Avoid additional NSAIDs.   Post op Anemia: 7.6 this AM, asymptomatic.  No transfusion as hgb >7 and remains asymptomatic  Altered mental status: minimize narcotics, will dc IV narcotics. Tylenol and low dose oxycodone 2.5-5mg  q6 available for post op pain. Zyprexa given 1 time on 1/1 and melatonin given for sleep   Post op recs: WB: WBAT LLE Abx: vanco and cipro x23 hours post op  (given hx of SOB with penicillin abx) extended abx with PO with bactrim x7 days ->switched to clindamycin 300mg  q8 on 1/1 given increasing Cr (increased infection risk given hx of lymphedema) Imaging: PACU xrays DVT prophylaxis: Xarelto 10mg  daily x4 weeks starting POD1 Follow up: 2 weeks after surgery for a wound check with Dr. Zachery Dakins at [facility].  Address: [address] [place], [place], [place] [address]  Office Phone: [phone]    Willaim Sheng 05/16/2021, 7:44 AM   Charlies Constable, MD  Contact information:   [phone] 7am-5pm epic message Dr. Zachery Dakins, or call office for patient follow up: [phone] After hours and holidays please check [email or web address] for group call information for [facility]

## 2021-05-16 NOTE — Progress Notes (Signed)
Patient discharge teaching given, including activity, diet, follow-up appoints, and medications. Discharge instructions left for [place]. Attempted to call report and Voicemail left.  IV access was d/c'd. Vitals are stable. Skin is intact except as charted in most recent assessments. Pt to be escorted out by [facility].

## 2021-05-16 NOTE — Progress Notes (Signed)
Report given to [facility] at [facility]

## 2021-05-16 NOTE — TOC Transition Note (Signed)
Transition of Care [facility]) - CM/SW Discharge Note   Patient Details  Name: Brandy Bowers MRN: [id number] Date of Birth: [birth year]  Transition of Care [facility]) CM/SW Contact:  Emeterio Reeve, LCSW Phone Number: 05/16/2021, 10:47 AM   Clinical Narrative:     Per MD patient ready for DC to [place]. RN, patient, patient's family, and facility notified of DC. Discharge Summary and FL2 sent to facility. DC packet on chart. Insurance Josem Kaufmann was not required. Pt was not retested for covid due to being positive on 04/25/21.  Ambulance transport requested for patient.    RN to call report to [phone].  CSW will sign off for now as social work intervention is no longer needed. Please consult [place] again if new needs arise.   Final next level of care: Skilled Nursing Facility Barriers to Discharge: Barriers Resolved   Patient Goals and CMS Choice Patient states their goals for this hospitalization and ongoing recovery are:: To be able to ambulate without pain CMS Medicare.gov Compare Post Acute Care list provided to:: Other (Comment Required) (medicare.gov website information was given to sister) Choice offered to / list presented to : Sibling  Discharge Placement              Patient chooses bed at: [place], [place] Patient to be transferred to facility by: [facility] Name of family member notified: sisters Patient and family notified of of transfer: 05/16/21  Discharge Plan and Services In-house Referral: Clinical Social Work                                   Social Determinants of Health (SDOH) Interventions     Readmission Risk Interventions No flowsheet data found.

## 2021-05-17 ENCOUNTER — Encounter (HOSPITAL_COMMUNITY): Payer: Self-pay | Admitting: Orthopedic Surgery

## 2021-05-17 DIAGNOSIS — R262 Difficulty in walking, not elsewhere classified: Secondary | ICD-10-CM | POA: Diagnosis not present

## 2021-05-17 DIAGNOSIS — D649 Anemia, unspecified: Secondary | ICD-10-CM | POA: Diagnosis not present

## 2021-05-17 DIAGNOSIS — Z96652 Presence of left artificial knee joint: Secondary | ICD-10-CM | POA: Diagnosis not present

## 2021-05-17 DIAGNOSIS — G8918 Other acute postprocedural pain: Secondary | ICD-10-CM | POA: Diagnosis not present

## 2021-05-26 DIAGNOSIS — R54 Age-related physical debility: Secondary | ICD-10-CM | POA: Diagnosis not present

## 2021-05-26 DIAGNOSIS — F03B0 Unspecified dementia, moderate, without behavioral disturbance, psychotic disturbance, mood disturbance, and anxiety: Secondary | ICD-10-CM | POA: Diagnosis not present

## 2021-05-26 DIAGNOSIS — R2689 Other abnormalities of gait and mobility: Secondary | ICD-10-CM | POA: Diagnosis not present

## 2021-05-26 DIAGNOSIS — M6281 Muscle weakness (generalized): Secondary | ICD-10-CM | POA: Diagnosis not present

## 2021-05-26 DIAGNOSIS — F0390 Unspecified dementia without behavioral disturbance: Secondary | ICD-10-CM | POA: Diagnosis not present

## 2021-05-26 DIAGNOSIS — M810 Age-related osteoporosis without current pathological fracture: Secondary | ICD-10-CM | POA: Diagnosis not present

## 2021-05-26 DIAGNOSIS — I10 Essential (primary) hypertension: Secondary | ICD-10-CM | POA: Diagnosis not present

## 2021-05-26 DIAGNOSIS — F5101 Primary insomnia: Secondary | ICD-10-CM | POA: Diagnosis not present

## 2021-05-26 DIAGNOSIS — E785 Hyperlipidemia, unspecified: Secondary | ICD-10-CM | POA: Diagnosis not present

## 2021-05-26 DIAGNOSIS — Z96652 Presence of left artificial knee joint: Secondary | ICD-10-CM | POA: Diagnosis not present

## 2021-05-27 ENCOUNTER — Ambulatory Visit (INDEPENDENT_AMBULATORY_CARE_PROVIDER_SITE_OTHER): Payer: Medicare Other | Admitting: Psychology

## 2021-05-27 ENCOUNTER — Other Ambulatory Visit: Payer: Self-pay

## 2021-05-27 ENCOUNTER — Ambulatory Visit: Payer: Medicare Other

## 2021-05-27 ENCOUNTER — Encounter: Payer: Self-pay | Admitting: Psychology

## 2021-05-27 DIAGNOSIS — F0280 Dementia in other diseases classified elsewhere without behavioral disturbance: Secondary | ICD-10-CM

## 2021-05-27 DIAGNOSIS — G309 Alzheimer's disease, unspecified: Secondary | ICD-10-CM

## 2021-05-27 DIAGNOSIS — R4189 Other symptoms and signs involving cognitive functions and awareness: Secondary | ICD-10-CM

## 2021-05-27 DIAGNOSIS — Z96652 Presence of left artificial knee joint: Secondary | ICD-10-CM | POA: Insufficient documentation

## 2021-05-27 NOTE — Progress Notes (Addendum)
° °  Psychometrician Note   Cognitive testing was administered to Brandy Bowers by Cruzita Lederer, B.S. (psychometrist) under the supervision of Dr. Christia Reading, Ph.D., licensed psychologist on 05/27/2021.    The battery of tests administered was selected by Dr. Christia Reading, Ph.D. with consideration to Brandy Bowers's current level of functioning, the nature of her symptoms, emotional and behavioral responses during interview, level of literacy, observed level of motivation/effort, and the nature of the referral question. This battery was communicated to the psychometrist. Communication between Dr. Christia Reading, Ph.D. and the psychometrist was ongoing throughout the evaluation and Dr. Christia Reading, Ph.D. was immediately accessible at all times. Dr. Christia Reading, Ph.D. provided supervision to the psychometrist on the date of this service to the extent necessary to assure the quality of all services provided.   Unfortunately, Brandy Bowers experienced fatigue and other pain symptoms related to a recent knee replacement procedure throughout early portions of testing. Subsequently, the decision was made to halt testing and postpone to another day.   A total of 20 minutes of billable time were spent face-to-face with Brandy Bowers by the psychometrist. This includes both test administration and scoring time. Billing for these services is reflected in the clinical report generated by Dr. Christia Reading, Ph.D.  This note reflects time spent with the psychometrician and does not include test scores or any clinical interpretations made by Dr. Melvyn Novas. The full report will follow in a separate note.
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
## 2021-05-31 DIAGNOSIS — M6281 Muscle weakness (generalized): Secondary | ICD-10-CM | POA: Diagnosis not present

## 2021-05-31 DIAGNOSIS — R54 Age-related physical debility: Secondary | ICD-10-CM | POA: Diagnosis not present

## 2021-05-31 DIAGNOSIS — E785 Hyperlipidemia, unspecified: Secondary | ICD-10-CM | POA: Diagnosis not present

## 2021-05-31 DIAGNOSIS — F0390 Unspecified dementia without behavioral disturbance: Secondary | ICD-10-CM | POA: Diagnosis not present

## 2021-05-31 DIAGNOSIS — Z96652 Presence of left artificial knee joint: Secondary | ICD-10-CM | POA: Diagnosis not present

## 2021-05-31 DIAGNOSIS — M810 Age-related osteoporosis without current pathological fracture: Secondary | ICD-10-CM | POA: Diagnosis not present

## 2021-05-31 DIAGNOSIS — R2689 Other abnormalities of gait and mobility: Secondary | ICD-10-CM | POA: Diagnosis not present

## 2021-05-31 DIAGNOSIS — I10 Essential (primary) hypertension: Secondary | ICD-10-CM | POA: Diagnosis not present

## 2021-06-02 ENCOUNTER — Encounter: Payer: Medicare Other | Admitting: Psychology

## 2021-06-02 DIAGNOSIS — M1712 Unilateral primary osteoarthritis, left knee: Secondary | ICD-10-CM | POA: Diagnosis not present

## 2021-06-03 ENCOUNTER — Encounter: Payer: Medicare Other | Admitting: Psychology

## 2021-06-06 ENCOUNTER — Ambulatory Visit: Payer: Medicare Other | Admitting: Physician Assistant

## 2021-06-08 DIAGNOSIS — F0390 Unspecified dementia without behavioral disturbance: Secondary | ICD-10-CM | POA: Diagnosis not present

## 2021-06-08 DIAGNOSIS — M810 Age-related osteoporosis without current pathological fracture: Secondary | ICD-10-CM | POA: Diagnosis not present

## 2021-06-08 DIAGNOSIS — I119 Hypertensive heart disease without heart failure: Secondary | ICD-10-CM | POA: Diagnosis not present

## 2021-06-08 DIAGNOSIS — Z96652 Presence of left artificial knee joint: Secondary | ICD-10-CM | POA: Diagnosis not present

## 2021-06-09 DIAGNOSIS — K5900 Constipation, unspecified: Secondary | ICD-10-CM | POA: Diagnosis not present

## 2021-06-09 DIAGNOSIS — R52 Pain, unspecified: Secondary | ICD-10-CM | POA: Diagnosis not present

## 2021-06-09 DIAGNOSIS — Z96652 Presence of left artificial knee joint: Secondary | ICD-10-CM | POA: Diagnosis not present

## 2021-06-09 DIAGNOSIS — E876 Hypokalemia: Secondary | ICD-10-CM | POA: Diagnosis not present

## 2021-06-09 DIAGNOSIS — I2510 Atherosclerotic heart disease of native coronary artery without angina pectoris: Secondary | ICD-10-CM | POA: Diagnosis not present

## 2021-06-09 DIAGNOSIS — D519 Vitamin B12 deficiency anemia, unspecified: Secondary | ICD-10-CM | POA: Diagnosis not present

## 2021-06-09 DIAGNOSIS — E119 Type 2 diabetes mellitus without complications: Secondary | ICD-10-CM | POA: Diagnosis not present

## 2021-06-09 DIAGNOSIS — R278 Other lack of coordination: Secondary | ICD-10-CM | POA: Diagnosis not present

## 2021-06-09 DIAGNOSIS — F02B0 Dementia in other diseases classified elsewhere, moderate, without behavioral disturbance, psychotic disturbance, mood disturbance, and anxiety: Secondary | ICD-10-CM | POA: Diagnosis not present

## 2021-06-09 DIAGNOSIS — E559 Vitamin D deficiency, unspecified: Secondary | ICD-10-CM | POA: Diagnosis not present

## 2021-06-09 DIAGNOSIS — F0280 Dementia in other diseases classified elsewhere without behavioral disturbance: Secondary | ICD-10-CM | POA: Diagnosis not present

## 2021-06-09 DIAGNOSIS — Z471 Aftercare following joint replacement surgery: Secondary | ICD-10-CM | POA: Diagnosis not present

## 2021-06-09 DIAGNOSIS — F03B0 Unspecified dementia, moderate, without behavioral disturbance, psychotic disturbance, mood disturbance, and anxiety: Secondary | ICD-10-CM | POA: Diagnosis not present

## 2021-06-09 DIAGNOSIS — M810 Age-related osteoporosis without current pathological fracture: Secondary | ICD-10-CM | POA: Diagnosis not present

## 2021-06-09 DIAGNOSIS — R296 Repeated falls: Secondary | ICD-10-CM | POA: Diagnosis not present

## 2021-06-09 DIAGNOSIS — G309 Alzheimer's disease, unspecified: Secondary | ICD-10-CM | POA: Diagnosis not present

## 2021-06-09 DIAGNOSIS — E785 Hyperlipidemia, unspecified: Secondary | ICD-10-CM | POA: Diagnosis not present

## 2021-06-09 DIAGNOSIS — R41841 Cognitive communication deficit: Secondary | ICD-10-CM | POA: Diagnosis not present

## 2021-06-09 DIAGNOSIS — F0390 Unspecified dementia without behavioral disturbance: Secondary | ICD-10-CM | POA: Diagnosis not present

## 2021-06-09 DIAGNOSIS — G4701 Insomnia due to medical condition: Secondary | ICD-10-CM | POA: Diagnosis not present

## 2021-06-09 DIAGNOSIS — M6259 Muscle wasting and atrophy, not elsewhere classified, multiple sites: Secondary | ICD-10-CM | POA: Diagnosis not present

## 2021-06-09 DIAGNOSIS — I10 Essential (primary) hypertension: Secondary | ICD-10-CM | POA: Diagnosis not present

## 2021-06-10 DIAGNOSIS — E559 Vitamin D deficiency, unspecified: Secondary | ICD-10-CM | POA: Diagnosis not present

## 2021-06-10 DIAGNOSIS — D519 Vitamin B12 deficiency anemia, unspecified: Secondary | ICD-10-CM | POA: Diagnosis not present

## 2021-06-10 DIAGNOSIS — Z471 Aftercare following joint replacement surgery: Secondary | ICD-10-CM | POA: Diagnosis not present

## 2021-06-10 DIAGNOSIS — E785 Hyperlipidemia, unspecified: Secondary | ICD-10-CM | POA: Diagnosis not present

## 2021-06-10 DIAGNOSIS — F0390 Unspecified dementia without behavioral disturbance: Secondary | ICD-10-CM | POA: Diagnosis not present

## 2021-06-10 DIAGNOSIS — I2510 Atherosclerotic heart disease of native coronary artery without angina pectoris: Secondary | ICD-10-CM | POA: Diagnosis not present

## 2021-06-10 DIAGNOSIS — I10 Essential (primary) hypertension: Secondary | ICD-10-CM | POA: Diagnosis not present

## 2021-06-11 DIAGNOSIS — I10 Essential (primary) hypertension: Secondary | ICD-10-CM | POA: Diagnosis not present

## 2021-06-11 DIAGNOSIS — E785 Hyperlipidemia, unspecified: Secondary | ICD-10-CM | POA: Diagnosis not present

## 2021-06-11 DIAGNOSIS — E559 Vitamin D deficiency, unspecified: Secondary | ICD-10-CM | POA: Diagnosis not present

## 2021-06-11 DIAGNOSIS — E119 Type 2 diabetes mellitus without complications: Secondary | ICD-10-CM | POA: Diagnosis not present

## 2021-06-13 DIAGNOSIS — Z471 Aftercare following joint replacement surgery: Secondary | ICD-10-CM | POA: Diagnosis not present

## 2021-06-13 DIAGNOSIS — D519 Vitamin B12 deficiency anemia, unspecified: Secondary | ICD-10-CM | POA: Diagnosis not present

## 2021-06-13 DIAGNOSIS — M810 Age-related osteoporosis without current pathological fracture: Secondary | ICD-10-CM | POA: Diagnosis not present

## 2021-06-13 DIAGNOSIS — E559 Vitamin D deficiency, unspecified: Secondary | ICD-10-CM | POA: Diagnosis not present

## 2021-06-13 DIAGNOSIS — E785 Hyperlipidemia, unspecified: Secondary | ICD-10-CM | POA: Diagnosis not present

## 2021-06-13 DIAGNOSIS — I2510 Atherosclerotic heart disease of native coronary artery without angina pectoris: Secondary | ICD-10-CM | POA: Diagnosis not present

## 2021-06-13 DIAGNOSIS — I10 Essential (primary) hypertension: Secondary | ICD-10-CM | POA: Diagnosis not present

## 2021-06-13 DIAGNOSIS — E876 Hypokalemia: Secondary | ICD-10-CM | POA: Diagnosis not present

## 2021-06-13 DIAGNOSIS — F0390 Unspecified dementia without behavioral disturbance: Secondary | ICD-10-CM | POA: Diagnosis not present

## 2021-06-14 DIAGNOSIS — D519 Vitamin B12 deficiency anemia, unspecified: Secondary | ICD-10-CM | POA: Diagnosis not present

## 2021-06-14 DIAGNOSIS — I10 Essential (primary) hypertension: Secondary | ICD-10-CM | POA: Diagnosis not present

## 2021-06-14 DIAGNOSIS — I2510 Atherosclerotic heart disease of native coronary artery without angina pectoris: Secondary | ICD-10-CM | POA: Diagnosis not present

## 2021-06-14 DIAGNOSIS — Z471 Aftercare following joint replacement surgery: Secondary | ICD-10-CM | POA: Diagnosis not present

## 2021-06-16 DIAGNOSIS — G4701 Insomnia due to medical condition: Secondary | ICD-10-CM | POA: Diagnosis not present

## 2021-06-16 DIAGNOSIS — F02B0 Dementia in other diseases classified elsewhere, moderate, without behavioral disturbance, psychotic disturbance, mood disturbance, and anxiety: Secondary | ICD-10-CM | POA: Diagnosis not present

## 2021-06-16 DIAGNOSIS — F03B0 Unspecified dementia, moderate, without behavioral disturbance, psychotic disturbance, mood disturbance, and anxiety: Secondary | ICD-10-CM | POA: Diagnosis not present

## 2021-06-21 DIAGNOSIS — E559 Vitamin D deficiency, unspecified: Secondary | ICD-10-CM | POA: Diagnosis not present

## 2021-06-21 DIAGNOSIS — I10 Essential (primary) hypertension: Secondary | ICD-10-CM | POA: Diagnosis not present

## 2021-06-21 DIAGNOSIS — Z471 Aftercare following joint replacement surgery: Secondary | ICD-10-CM | POA: Diagnosis not present

## 2021-06-21 DIAGNOSIS — F0390 Unspecified dementia without behavioral disturbance: Secondary | ICD-10-CM | POA: Diagnosis not present

## 2021-06-24 ENCOUNTER — Ambulatory Visit (INDEPENDENT_AMBULATORY_CARE_PROVIDER_SITE_OTHER): Payer: Medicare Other | Admitting: Psychology

## 2021-06-24 ENCOUNTER — Encounter: Payer: Self-pay | Admitting: Psychology

## 2021-06-24 ENCOUNTER — Ambulatory Visit: Payer: Medicare Other | Admitting: Psychology

## 2021-06-24 ENCOUNTER — Other Ambulatory Visit: Payer: Self-pay

## 2021-06-24 DIAGNOSIS — M810 Age-related osteoporosis without current pathological fracture: Secondary | ICD-10-CM | POA: Insufficient documentation

## 2021-06-24 DIAGNOSIS — G309 Alzheimer's disease, unspecified: Secondary | ICD-10-CM | POA: Diagnosis not present

## 2021-06-24 DIAGNOSIS — F0280 Dementia in other diseases classified elsewhere without behavioral disturbance: Secondary | ICD-10-CM

## 2021-06-24 DIAGNOSIS — R4189 Other symptoms and signs involving cognitive functions and awareness: Secondary | ICD-10-CM

## 2021-06-24 DIAGNOSIS — E785 Hyperlipidemia, unspecified: Secondary | ICD-10-CM | POA: Insufficient documentation

## 2021-06-24 NOTE — Progress Notes (Signed)
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 None to Slight   Informed Consent and Coding/Compliance:   The current evaluation represents a clinical evaluation for the purposes previously outlined by the referral source and is in no way reflective of a forensic evaluation.   Brandy Bowers. Severtson was provided with a verbal description of the nature and purpose of the present neuropsychological evaluation. Also reviewed were the foreseeable risks and/or discomforts and benefits of the procedure, limits of confidentiality, and mandatory reporting requirements of this provider. The patient was given the opportunity to ask questions and receive answers about the evaluation. Oral consent to participate was provided by the patient.   This evaluation was conducted by Christia Reading, Ph.D., ABPP-CN, board certified clinical neuropsychologist. Brandy Bowers. Oetken completed 100 minutes of cognitive testing and scoring, billed as one unit [phone] and two additional units [phone]. Psychometrist Milana Kidney, B.S., assisted Dr. Melvyn Novas with test administration and scoring procedures. As a separate and discrete service, Dr. Melvyn Novas spent a total of 160 minutes in interpretation and report writing  billed as one unit [phone] and two units [id number].

## 2021-06-24 NOTE — Progress Notes (Signed)
° °  Psychometrician Note   Cognitive testing was administered to Brandy Bowers by Milana Kidney, B.S. (psychometrist) under the supervision of Dr. Christia Reading, Ph.D., licensed psychologist on 06/24/21. Brandy Bowers did not appear overtly distressed by the testing session per behavioral observation or responses across self-report questionnaires. Rest breaks were offered.    The battery of tests administered was selected by Dr. Christia Reading, Ph.D. with consideration to Brandy Bowers's current level of functioning, the nature of her symptoms, emotional and behavioral responses during interview, level of literacy, observed level of motivation/effort, and the nature of the referral question. This battery was communicated to the psychometrist. Communication between Dr. Christia Reading, Ph.D. and the psychometrist was ongoing throughout the evaluation and Dr. Christia Reading, Ph.D. was immediately accessible at all times. Dr. Christia Reading, Ph.D. provided supervision to the psychometrist on the date of this service to the extent necessary to assure the quality of all services provided.    Brandy Bowers will return within approximately 1-2 weeks for an interactive feedback session with Dr. Melvyn Novas at which time her test performances, clinical impressions, and treatment recommendations will be reviewed in detail. Brandy Bowers understands she can contact our office should she require our assistance before this time.  A total of 100 minutes of billable time were spent face-to-face with Brandy Bowers by the psychometrist. This includes both test administration and scoring time. Billing for these services is reflected in the clinical report generated by Dr. Christia Reading, Ph.D.  This note reflects time spent with the psychometrician and does not include test scores or any clinical interpretations made by Dr. Melvyn Novas. The full report will follow in a separate note.

## 2021-06-30 DIAGNOSIS — M1712 Unilateral primary osteoarthritis, left knee: Secondary | ICD-10-CM | POA: Diagnosis not present

## 2021-06-30 DIAGNOSIS — G300 Alzheimer's disease with early onset: Secondary | ICD-10-CM | POA: Diagnosis not present

## 2021-06-30 DIAGNOSIS — F02B0 Dementia in other diseases classified elsewhere, moderate, without behavioral disturbance, psychotic disturbance, mood disturbance, and anxiety: Secondary | ICD-10-CM | POA: Diagnosis not present

## 2021-07-06 DIAGNOSIS — K5900 Constipation, unspecified: Secondary | ICD-10-CM | POA: Diagnosis not present

## 2021-07-06 DIAGNOSIS — E785 Hyperlipidemia, unspecified: Secondary | ICD-10-CM | POA: Diagnosis not present

## 2021-07-06 DIAGNOSIS — I10 Essential (primary) hypertension: Secondary | ICD-10-CM | POA: Diagnosis not present

## 2021-07-06 DIAGNOSIS — M810 Age-related osteoporosis without current pathological fracture: Secondary | ICD-10-CM | POA: Diagnosis not present

## 2021-07-06 DIAGNOSIS — E876 Hypokalemia: Secondary | ICD-10-CM | POA: Diagnosis not present

## 2021-07-06 DIAGNOSIS — I2510 Atherosclerotic heart disease of native coronary artery without angina pectoris: Secondary | ICD-10-CM | POA: Diagnosis not present

## 2021-07-06 DIAGNOSIS — R52 Pain, unspecified: Secondary | ICD-10-CM | POA: Diagnosis not present

## 2021-07-06 DIAGNOSIS — F0390 Unspecified dementia without behavioral disturbance: Secondary | ICD-10-CM | POA: Diagnosis not present

## 2021-07-06 DIAGNOSIS — E559 Vitamin D deficiency, unspecified: Secondary | ICD-10-CM | POA: Diagnosis not present

## 2021-07-06 DIAGNOSIS — R278 Other lack of coordination: Secondary | ICD-10-CM | POA: Diagnosis not present

## 2021-07-06 DIAGNOSIS — Z471 Aftercare following joint replacement surgery: Secondary | ICD-10-CM | POA: Diagnosis not present

## 2021-07-06 DIAGNOSIS — R41841 Cognitive communication deficit: Secondary | ICD-10-CM | POA: Diagnosis not present

## 2021-07-06 DIAGNOSIS — R296 Repeated falls: Secondary | ICD-10-CM | POA: Diagnosis not present

## 2021-07-06 DIAGNOSIS — M6259 Muscle wasting and atrophy, not elsewhere classified, multiple sites: Secondary | ICD-10-CM | POA: Diagnosis not present

## 2021-07-06 DIAGNOSIS — D519 Vitamin B12 deficiency anemia, unspecified: Secondary | ICD-10-CM | POA: Diagnosis not present

## 2021-07-06 DIAGNOSIS — Z96652 Presence of left artificial knee joint: Secondary | ICD-10-CM | POA: Diagnosis not present

## 2021-07-07 DIAGNOSIS — M6259 Muscle wasting and atrophy, not elsewhere classified, multiple sites: Secondary | ICD-10-CM | POA: Diagnosis not present

## 2021-07-07 DIAGNOSIS — Z96652 Presence of left artificial knee joint: Secondary | ICD-10-CM | POA: Diagnosis not present

## 2021-07-07 DIAGNOSIS — R278 Other lack of coordination: Secondary | ICD-10-CM | POA: Diagnosis not present

## 2021-07-07 DIAGNOSIS — Z471 Aftercare following joint replacement surgery: Secondary | ICD-10-CM | POA: Diagnosis not present

## 2021-07-07 DIAGNOSIS — R296 Repeated falls: Secondary | ICD-10-CM | POA: Diagnosis not present

## 2021-07-07 DIAGNOSIS — R52 Pain, unspecified: Secondary | ICD-10-CM | POA: Diagnosis not present

## 2021-07-08 DIAGNOSIS — R296 Repeated falls: Secondary | ICD-10-CM | POA: Diagnosis not present

## 2021-07-08 DIAGNOSIS — R278 Other lack of coordination: Secondary | ICD-10-CM | POA: Diagnosis not present

## 2021-07-08 DIAGNOSIS — M6259 Muscle wasting and atrophy, not elsewhere classified, multiple sites: Secondary | ICD-10-CM | POA: Diagnosis not present

## 2021-07-08 DIAGNOSIS — Z471 Aftercare following joint replacement surgery: Secondary | ICD-10-CM | POA: Diagnosis not present

## 2021-07-08 DIAGNOSIS — R52 Pain, unspecified: Secondary | ICD-10-CM | POA: Diagnosis not present

## 2021-07-08 DIAGNOSIS — Z96652 Presence of left artificial knee joint: Secondary | ICD-10-CM | POA: Diagnosis not present

## 2021-07-09 DIAGNOSIS — M79662 Pain in left lower leg: Secondary | ICD-10-CM | POA: Diagnosis not present

## 2021-07-09 DIAGNOSIS — M25562 Pain in left knee: Secondary | ICD-10-CM | POA: Diagnosis not present

## 2021-07-11 DIAGNOSIS — Z96652 Presence of left artificial knee joint: Secondary | ICD-10-CM | POA: Diagnosis not present

## 2021-07-11 DIAGNOSIS — R52 Pain, unspecified: Secondary | ICD-10-CM | POA: Diagnosis not present

## 2021-07-11 DIAGNOSIS — M6259 Muscle wasting and atrophy, not elsewhere classified, multiple sites: Secondary | ICD-10-CM | POA: Diagnosis not present

## 2021-07-11 DIAGNOSIS — Z471 Aftercare following joint replacement surgery: Secondary | ICD-10-CM | POA: Diagnosis not present

## 2021-07-11 DIAGNOSIS — R296 Repeated falls: Secondary | ICD-10-CM | POA: Diagnosis not present

## 2021-07-11 DIAGNOSIS — R278 Other lack of coordination: Secondary | ICD-10-CM | POA: Diagnosis not present

## 2021-07-12 DIAGNOSIS — R296 Repeated falls: Secondary | ICD-10-CM | POA: Diagnosis not present

## 2021-07-12 DIAGNOSIS — Z471 Aftercare following joint replacement surgery: Secondary | ICD-10-CM | POA: Diagnosis not present

## 2021-07-12 DIAGNOSIS — M6259 Muscle wasting and atrophy, not elsewhere classified, multiple sites: Secondary | ICD-10-CM | POA: Diagnosis not present

## 2021-07-12 DIAGNOSIS — R52 Pain, unspecified: Secondary | ICD-10-CM | POA: Diagnosis not present

## 2021-07-12 DIAGNOSIS — R278 Other lack of coordination: Secondary | ICD-10-CM | POA: Diagnosis not present

## 2021-07-12 DIAGNOSIS — Z96652 Presence of left artificial knee joint: Secondary | ICD-10-CM | POA: Diagnosis not present

## 2021-07-13 DIAGNOSIS — E559 Vitamin D deficiency, unspecified: Secondary | ICD-10-CM | POA: Diagnosis not present

## 2021-07-13 DIAGNOSIS — F0390 Unspecified dementia without behavioral disturbance: Secondary | ICD-10-CM | POA: Diagnosis not present

## 2021-07-13 DIAGNOSIS — E876 Hypokalemia: Secondary | ICD-10-CM | POA: Diagnosis not present

## 2021-07-13 DIAGNOSIS — Z471 Aftercare following joint replacement surgery: Secondary | ICD-10-CM | POA: Diagnosis not present

## 2021-07-13 DIAGNOSIS — M810 Age-related osteoporosis without current pathological fracture: Secondary | ICD-10-CM | POA: Diagnosis not present

## 2021-07-13 DIAGNOSIS — R41841 Cognitive communication deficit: Secondary | ICD-10-CM | POA: Diagnosis not present

## 2021-07-13 DIAGNOSIS — R52 Pain, unspecified: Secondary | ICD-10-CM | POA: Diagnosis not present

## 2021-07-13 DIAGNOSIS — K5900 Constipation, unspecified: Secondary | ICD-10-CM | POA: Diagnosis not present

## 2021-07-13 DIAGNOSIS — R278 Other lack of coordination: Secondary | ICD-10-CM | POA: Diagnosis not present

## 2021-07-13 DIAGNOSIS — D519 Vitamin B12 deficiency anemia, unspecified: Secondary | ICD-10-CM | POA: Diagnosis not present

## 2021-07-13 DIAGNOSIS — G309 Alzheimer's disease, unspecified: Secondary | ICD-10-CM | POA: Diagnosis not present

## 2021-07-13 DIAGNOSIS — R296 Repeated falls: Secondary | ICD-10-CM | POA: Diagnosis not present

## 2021-07-13 DIAGNOSIS — I10 Essential (primary) hypertension: Secondary | ICD-10-CM | POA: Diagnosis not present

## 2021-07-13 DIAGNOSIS — M6259 Muscle wasting and atrophy, not elsewhere classified, multiple sites: Secondary | ICD-10-CM | POA: Diagnosis not present

## 2021-07-13 DIAGNOSIS — Z96652 Presence of left artificial knee joint: Secondary | ICD-10-CM | POA: Diagnosis not present

## 2021-07-13 DIAGNOSIS — I2510 Atherosclerotic heart disease of native coronary artery without angina pectoris: Secondary | ICD-10-CM | POA: Diagnosis not present

## 2021-07-13 DIAGNOSIS — E785 Hyperlipidemia, unspecified: Secondary | ICD-10-CM | POA: Diagnosis not present

## 2021-07-14 DIAGNOSIS — M6259 Muscle wasting and atrophy, not elsewhere classified, multiple sites: Secondary | ICD-10-CM | POA: Diagnosis not present

## 2021-07-14 DIAGNOSIS — G4701 Insomnia due to medical condition: Secondary | ICD-10-CM | POA: Diagnosis not present

## 2021-07-14 DIAGNOSIS — D519 Vitamin B12 deficiency anemia, unspecified: Secondary | ICD-10-CM | POA: Diagnosis not present

## 2021-07-14 DIAGNOSIS — Z471 Aftercare following joint replacement surgery: Secondary | ICD-10-CM | POA: Diagnosis not present

## 2021-07-14 DIAGNOSIS — G300 Alzheimer's disease with early onset: Secondary | ICD-10-CM | POA: Diagnosis not present

## 2021-07-14 DIAGNOSIS — E559 Vitamin D deficiency, unspecified: Secondary | ICD-10-CM | POA: Diagnosis not present

## 2021-07-14 DIAGNOSIS — R278 Other lack of coordination: Secondary | ICD-10-CM | POA: Diagnosis not present

## 2021-07-14 DIAGNOSIS — R296 Repeated falls: Secondary | ICD-10-CM | POA: Diagnosis not present

## 2021-07-14 DIAGNOSIS — Z96652 Presence of left artificial knee joint: Secondary | ICD-10-CM | POA: Diagnosis not present

## 2021-07-14 DIAGNOSIS — R52 Pain, unspecified: Secondary | ICD-10-CM | POA: Diagnosis not present

## 2021-07-14 DIAGNOSIS — F02B0 Dementia in other diseases classified elsewhere, moderate, without behavioral disturbance, psychotic disturbance, mood disturbance, and anxiety: Secondary | ICD-10-CM | POA: Diagnosis not present

## 2021-07-15 DIAGNOSIS — R52 Pain, unspecified: Secondary | ICD-10-CM | POA: Diagnosis not present

## 2021-07-15 DIAGNOSIS — Z471 Aftercare following joint replacement surgery: Secondary | ICD-10-CM | POA: Diagnosis not present

## 2021-07-15 DIAGNOSIS — R278 Other lack of coordination: Secondary | ICD-10-CM | POA: Diagnosis not present

## 2021-07-15 DIAGNOSIS — Z96652 Presence of left artificial knee joint: Secondary | ICD-10-CM | POA: Diagnosis not present

## 2021-07-15 DIAGNOSIS — M6259 Muscle wasting and atrophy, not elsewhere classified, multiple sites: Secondary | ICD-10-CM | POA: Diagnosis not present

## 2021-07-15 DIAGNOSIS — R296 Repeated falls: Secondary | ICD-10-CM | POA: Diagnosis not present
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
## 2021-07-18 ENCOUNTER — Encounter: Payer: Self-pay | Admitting: Physician Assistant

## 2021-07-18 ENCOUNTER — Other Ambulatory Visit: Payer: Self-pay

## 2021-07-18 ENCOUNTER — Telehealth (INDEPENDENT_AMBULATORY_CARE_PROVIDER_SITE_OTHER): Payer: Medicare Other | Admitting: Physician Assistant

## 2021-07-18 VITALS — Ht 65.0 in | Wt 159.0 lb

## 2021-07-18 DIAGNOSIS — F0280 Dementia in other diseases classified elsewhere without behavioral disturbance: Secondary | ICD-10-CM

## 2021-07-18 DIAGNOSIS — Z471 Aftercare following joint replacement surgery: Secondary | ICD-10-CM | POA: Diagnosis not present

## 2021-07-18 DIAGNOSIS — G309 Alzheimer's disease, unspecified: Secondary | ICD-10-CM | POA: Diagnosis not present

## 2021-07-18 DIAGNOSIS — R296 Repeated falls: Secondary | ICD-10-CM | POA: Diagnosis not present

## 2021-07-18 DIAGNOSIS — R52 Pain, unspecified: Secondary | ICD-10-CM | POA: Diagnosis not present

## 2021-07-18 DIAGNOSIS — Z96652 Presence of left artificial knee joint: Secondary | ICD-10-CM | POA: Diagnosis not present

## 2021-07-18 DIAGNOSIS — M6259 Muscle wasting and atrophy, not elsewhere classified, multiple sites: Secondary | ICD-10-CM | POA: Diagnosis not present

## 2021-07-18 DIAGNOSIS — R278 Other lack of coordination: Secondary | ICD-10-CM | POA: Diagnosis not present

## 2021-07-19 DIAGNOSIS — Z96652 Presence of left artificial knee joint: Secondary | ICD-10-CM | POA: Diagnosis not present

## 2021-07-19 DIAGNOSIS — Z471 Aftercare following joint replacement surgery: Secondary | ICD-10-CM | POA: Diagnosis not present

## 2021-07-19 DIAGNOSIS — R52 Pain, unspecified: Secondary | ICD-10-CM | POA: Diagnosis not present

## 2021-07-19 DIAGNOSIS — R296 Repeated falls: Secondary | ICD-10-CM | POA: Diagnosis not present

## 2021-07-19 DIAGNOSIS — M6259 Muscle wasting and atrophy, not elsewhere classified, multiple sites: Secondary | ICD-10-CM | POA: Diagnosis not present

## 2021-07-19 DIAGNOSIS — R278 Other lack of coordination: Secondary | ICD-10-CM | POA: Diagnosis not present

## 2021-07-20 DIAGNOSIS — R296 Repeated falls: Secondary | ICD-10-CM | POA: Diagnosis not present

## 2021-07-20 DIAGNOSIS — Z471 Aftercare following joint replacement surgery: Secondary | ICD-10-CM | POA: Diagnosis not present

## 2021-07-20 DIAGNOSIS — R52 Pain, unspecified: Secondary | ICD-10-CM | POA: Diagnosis not present

## 2021-07-20 DIAGNOSIS — Z96652 Presence of left artificial knee joint: Secondary | ICD-10-CM | POA: Diagnosis not present

## 2021-07-20 DIAGNOSIS — R278 Other lack of coordination: Secondary | ICD-10-CM | POA: Diagnosis not present

## 2021-07-20 DIAGNOSIS — M6259 Muscle wasting and atrophy, not elsewhere classified, multiple sites: Secondary | ICD-10-CM | POA: Diagnosis not present

## 2021-07-21 DIAGNOSIS — M6259 Muscle wasting and atrophy, not elsewhere classified, multiple sites: Secondary | ICD-10-CM | POA: Diagnosis not present

## 2021-07-21 DIAGNOSIS — R296 Repeated falls: Secondary | ICD-10-CM | POA: Diagnosis not present

## 2021-07-21 DIAGNOSIS — R52 Pain, unspecified: Secondary | ICD-10-CM | POA: Diagnosis not present

## 2021-07-21 DIAGNOSIS — Z471 Aftercare following joint replacement surgery: Secondary | ICD-10-CM | POA: Diagnosis not present

## 2021-07-21 DIAGNOSIS — Z96652 Presence of left artificial knee joint: Secondary | ICD-10-CM | POA: Diagnosis not present

## 2021-07-21 DIAGNOSIS — R278 Other lack of coordination: Secondary | ICD-10-CM | POA: Diagnosis not present

## 2021-07-22 ENCOUNTER — Other Ambulatory Visit: Payer: Self-pay | Admitting: Physician Assistant

## 2021-07-22 ENCOUNTER — Telehealth: Payer: Self-pay | Admitting: Physician Assistant

## 2021-07-22 DIAGNOSIS — Z96652 Presence of left artificial knee joint: Secondary | ICD-10-CM | POA: Diagnosis not present

## 2021-07-22 DIAGNOSIS — R52 Pain, unspecified: Secondary | ICD-10-CM | POA: Diagnosis not present

## 2021-07-22 DIAGNOSIS — R296 Repeated falls: Secondary | ICD-10-CM | POA: Diagnosis not present

## 2021-07-22 DIAGNOSIS — M6259 Muscle wasting and atrophy, not elsewhere classified, multiple sites: Secondary | ICD-10-CM | POA: Diagnosis not present

## 2021-07-22 DIAGNOSIS — R278 Other lack of coordination: Secondary | ICD-10-CM | POA: Diagnosis not present

## 2021-07-22 DIAGNOSIS — Z471 Aftercare following joint replacement surgery: Secondary | ICD-10-CM | POA: Diagnosis not present

## 2021-07-22 MED ORDER — ESCITALOPRAM OXALATE 10 MG PO TABS: 10.0000 mg | ORAL_TABLET | Freq: Every day | ORAL | 3 refills | Status: AC

## 2021-07-22 NOTE — Telephone Encounter (Signed)
Brandy Bowers was called an informed that script was faxed to the facility  ?

## 2021-07-22 NOTE — Telephone Encounter (Signed)
1. Which medications need refilled? (List name and dosage, if known) Lexapro ? ?2. Which pharmacy/location is medication to be sent to? (include street and city if Management consultant) [facility] in [place] ? ?Pt's sister says her hemoglobin was 9.7 in the nursing home last month.  ? ?

## 2021-07-22 NOTE — Telephone Encounter (Signed)
Spoke with pt sister in law and she stated that the family would like to start the Lexapro that was recommended by our office, she also sent in pt last month hemoglobin was 9.7 in the nursing home  ?

## 2021-07-25 DIAGNOSIS — R278 Other lack of coordination: Secondary | ICD-10-CM | POA: Diagnosis not present

## 2021-07-25 DIAGNOSIS — Z96652 Presence of left artificial knee joint: Secondary | ICD-10-CM | POA: Diagnosis not present

## 2021-07-25 DIAGNOSIS — R296 Repeated falls: Secondary | ICD-10-CM | POA: Diagnosis not present

## 2021-07-25 DIAGNOSIS — R52 Pain, unspecified: Secondary | ICD-10-CM | POA: Diagnosis not present

## 2021-07-25 DIAGNOSIS — M6259 Muscle wasting and atrophy, not elsewhere classified, multiple sites: Secondary | ICD-10-CM | POA: Diagnosis not present

## 2021-07-25 DIAGNOSIS — Z471 Aftercare following joint replacement surgery: Secondary | ICD-10-CM | POA: Diagnosis not present

## 2021-07-26 DIAGNOSIS — R278 Other lack of coordination: Secondary | ICD-10-CM | POA: Diagnosis not present

## 2021-07-26 DIAGNOSIS — Z471 Aftercare following joint replacement surgery: Secondary | ICD-10-CM | POA: Diagnosis not present

## 2021-07-26 DIAGNOSIS — Z96652 Presence of left artificial knee joint: Secondary | ICD-10-CM | POA: Diagnosis not present

## 2021-07-26 DIAGNOSIS — M6259 Muscle wasting and atrophy, not elsewhere classified, multiple sites: Secondary | ICD-10-CM | POA: Diagnosis not present

## 2021-07-26 DIAGNOSIS — R52 Pain, unspecified: Secondary | ICD-10-CM | POA: Diagnosis not present

## 2021-07-26 DIAGNOSIS — R296 Repeated falls: Secondary | ICD-10-CM | POA: Diagnosis not present

## 2021-07-27 DIAGNOSIS — R296 Repeated falls: Secondary | ICD-10-CM | POA: Diagnosis not present

## 2021-07-27 DIAGNOSIS — M6259 Muscle wasting and atrophy, not elsewhere classified, multiple sites: Secondary | ICD-10-CM | POA: Diagnosis not present

## 2021-07-27 DIAGNOSIS — R278 Other lack of coordination: Secondary | ICD-10-CM | POA: Diagnosis not present

## 2021-07-27 DIAGNOSIS — Z96652 Presence of left artificial knee joint: Secondary | ICD-10-CM | POA: Diagnosis not present

## 2021-07-27 DIAGNOSIS — R52 Pain, unspecified: Secondary | ICD-10-CM | POA: Diagnosis not present

## 2021-07-27 DIAGNOSIS — Z471 Aftercare following joint replacement surgery: Secondary | ICD-10-CM | POA: Diagnosis not present

## 2021-07-28 DIAGNOSIS — Z471 Aftercare following joint replacement surgery: Secondary | ICD-10-CM | POA: Diagnosis not present

## 2021-07-28 DIAGNOSIS — M6259 Muscle wasting and atrophy, not elsewhere classified, multiple sites: Secondary | ICD-10-CM | POA: Diagnosis not present

## 2021-07-28 DIAGNOSIS — Z96652 Presence of left artificial knee joint: Secondary | ICD-10-CM | POA: Diagnosis not present

## 2021-07-28 DIAGNOSIS — R296 Repeated falls: Secondary | ICD-10-CM | POA: Diagnosis not present

## 2021-07-28 DIAGNOSIS — R278 Other lack of coordination: Secondary | ICD-10-CM | POA: Diagnosis not present

## 2021-07-28 DIAGNOSIS — R52 Pain, unspecified: Secondary | ICD-10-CM | POA: Diagnosis not present

## 2021-07-29 DIAGNOSIS — M6259 Muscle wasting and atrophy, not elsewhere classified, multiple sites: Secondary | ICD-10-CM | POA: Diagnosis not present

## 2021-07-29 DIAGNOSIS — R52 Pain, unspecified: Secondary | ICD-10-CM | POA: Diagnosis not present

## 2021-07-29 DIAGNOSIS — Z471 Aftercare following joint replacement surgery: Secondary | ICD-10-CM | POA: Diagnosis not present

## 2021-07-29 DIAGNOSIS — R278 Other lack of coordination: Secondary | ICD-10-CM | POA: Diagnosis not present

## 2021-07-29 DIAGNOSIS — R296 Repeated falls: Secondary | ICD-10-CM | POA: Diagnosis not present

## 2021-07-29 DIAGNOSIS — Z96652 Presence of left artificial knee joint: Secondary | ICD-10-CM | POA: Diagnosis not present

## 2021-07-30 DIAGNOSIS — R52 Pain, unspecified: Secondary | ICD-10-CM | POA: Diagnosis not present

## 2021-07-30 DIAGNOSIS — Z471 Aftercare following joint replacement surgery: Secondary | ICD-10-CM | POA: Diagnosis not present

## 2021-07-30 DIAGNOSIS — R296 Repeated falls: Secondary | ICD-10-CM | POA: Diagnosis not present

## 2021-07-30 DIAGNOSIS — M6259 Muscle wasting and atrophy, not elsewhere classified, multiple sites: Secondary | ICD-10-CM | POA: Diagnosis not present

## 2021-07-30 DIAGNOSIS — Z96652 Presence of left artificial knee joint: Secondary | ICD-10-CM | POA: Diagnosis not present

## 2021-07-30 DIAGNOSIS — R278 Other lack of coordination: Secondary | ICD-10-CM | POA: Diagnosis not present

## 2021-08-01 DIAGNOSIS — Z471 Aftercare following joint replacement surgery: Secondary | ICD-10-CM | POA: Diagnosis not present

## 2021-08-01 DIAGNOSIS — R52 Pain, unspecified: Secondary | ICD-10-CM | POA: Diagnosis not present

## 2021-08-01 DIAGNOSIS — Z96652 Presence of left artificial knee joint: Secondary | ICD-10-CM | POA: Diagnosis not present

## 2021-08-01 DIAGNOSIS — R296 Repeated falls: Secondary | ICD-10-CM | POA: Diagnosis not present

## 2021-08-01 DIAGNOSIS — M6259 Muscle wasting and atrophy, not elsewhere classified, multiple sites: Secondary | ICD-10-CM | POA: Diagnosis not present

## 2021-08-01 DIAGNOSIS — R278 Other lack of coordination: Secondary | ICD-10-CM | POA: Diagnosis not present

## 2021-08-02 DIAGNOSIS — R52 Pain, unspecified: Secondary | ICD-10-CM | POA: Diagnosis not present

## 2021-08-02 DIAGNOSIS — M6259 Muscle wasting and atrophy, not elsewhere classified, multiple sites: Secondary | ICD-10-CM | POA: Diagnosis not present

## 2021-08-02 DIAGNOSIS — Z471 Aftercare following joint replacement surgery: Secondary | ICD-10-CM | POA: Diagnosis not present

## 2021-08-02 DIAGNOSIS — Z96652 Presence of left artificial knee joint: Secondary | ICD-10-CM | POA: Diagnosis not present

## 2021-08-02 DIAGNOSIS — R296 Repeated falls: Secondary | ICD-10-CM | POA: Diagnosis not present

## 2021-08-02 DIAGNOSIS — R278 Other lack of coordination: Secondary | ICD-10-CM | POA: Diagnosis not present

## 2021-08-03 DIAGNOSIS — M6259 Muscle wasting and atrophy, not elsewhere classified, multiple sites: Secondary | ICD-10-CM | POA: Diagnosis not present

## 2021-08-03 DIAGNOSIS — Z471 Aftercare following joint replacement surgery: Secondary | ICD-10-CM | POA: Diagnosis not present

## 2021-08-03 DIAGNOSIS — R52 Pain, unspecified: Secondary | ICD-10-CM | POA: Diagnosis not present

## 2021-08-03 DIAGNOSIS — R296 Repeated falls: Secondary | ICD-10-CM | POA: Diagnosis not present

## 2021-08-03 DIAGNOSIS — Z96652 Presence of left artificial knee joint: Secondary | ICD-10-CM | POA: Diagnosis not present

## 2021-08-03 DIAGNOSIS — R278 Other lack of coordination: Secondary | ICD-10-CM | POA: Diagnosis not present

## 2021-08-04 DIAGNOSIS — R278 Other lack of coordination: Secondary | ICD-10-CM | POA: Diagnosis not present

## 2021-08-04 DIAGNOSIS — M6259 Muscle wasting and atrophy, not elsewhere classified, multiple sites: Secondary | ICD-10-CM | POA: Diagnosis not present

## 2021-08-04 DIAGNOSIS — Z96652 Presence of left artificial knee joint: Secondary | ICD-10-CM | POA: Diagnosis not present

## 2021-08-04 DIAGNOSIS — R296 Repeated falls: Secondary | ICD-10-CM | POA: Diagnosis not present

## 2021-08-04 DIAGNOSIS — R52 Pain, unspecified: Secondary | ICD-10-CM | POA: Diagnosis not present

## 2021-08-04 DIAGNOSIS — Z471 Aftercare following joint replacement surgery: Secondary | ICD-10-CM | POA: Diagnosis not present

## 2021-08-05 DIAGNOSIS — R278 Other lack of coordination: Secondary | ICD-10-CM | POA: Diagnosis not present

## 2021-08-05 DIAGNOSIS — M6259 Muscle wasting and atrophy, not elsewhere classified, multiple sites: Secondary | ICD-10-CM | POA: Diagnosis not present

## 2021-08-05 DIAGNOSIS — R296 Repeated falls: Secondary | ICD-10-CM | POA: Diagnosis not present

## 2021-08-05 DIAGNOSIS — R52 Pain, unspecified: Secondary | ICD-10-CM | POA: Diagnosis not present

## 2021-08-05 DIAGNOSIS — Z471 Aftercare following joint replacement surgery: Secondary | ICD-10-CM | POA: Diagnosis not present

## 2021-08-05 DIAGNOSIS — Z96652 Presence of left artificial knee joint: Secondary | ICD-10-CM | POA: Diagnosis not present

## 2021-08-08 DIAGNOSIS — R278 Other lack of coordination: Secondary | ICD-10-CM | POA: Diagnosis not present

## 2021-08-08 DIAGNOSIS — Z471 Aftercare following joint replacement surgery: Secondary | ICD-10-CM | POA: Diagnosis not present

## 2021-08-08 DIAGNOSIS — R52 Pain, unspecified: Secondary | ICD-10-CM | POA: Diagnosis not present

## 2021-08-08 DIAGNOSIS — Z96652 Presence of left artificial knee joint: Secondary | ICD-10-CM | POA: Diagnosis not present

## 2021-08-08 DIAGNOSIS — R296 Repeated falls: Secondary | ICD-10-CM | POA: Diagnosis not present

## 2021-08-08 DIAGNOSIS — M6259 Muscle wasting and atrophy, not elsewhere classified, multiple sites: Secondary | ICD-10-CM | POA: Diagnosis not present

## 2021-08-09 DIAGNOSIS — Z471 Aftercare following joint replacement surgery: Secondary | ICD-10-CM | POA: Diagnosis not present

## 2021-08-09 DIAGNOSIS — R296 Repeated falls: Secondary | ICD-10-CM | POA: Diagnosis not present

## 2021-08-09 DIAGNOSIS — R52 Pain, unspecified: Secondary | ICD-10-CM | POA: Diagnosis not present

## 2021-08-09 DIAGNOSIS — R278 Other lack of coordination: Secondary | ICD-10-CM | POA: Diagnosis not present

## 2021-08-09 DIAGNOSIS — Z96652 Presence of left artificial knee joint: Secondary | ICD-10-CM | POA: Diagnosis not present

## 2021-08-09 DIAGNOSIS — M6259 Muscle wasting and atrophy, not elsewhere classified, multiple sites: Secondary | ICD-10-CM | POA: Diagnosis not present

## 2021-08-10 DIAGNOSIS — R278 Other lack of coordination: Secondary | ICD-10-CM | POA: Diagnosis not present

## 2021-08-10 DIAGNOSIS — Z471 Aftercare following joint replacement surgery: Secondary | ICD-10-CM | POA: Diagnosis not present

## 2021-08-10 DIAGNOSIS — I10 Essential (primary) hypertension: Secondary | ICD-10-CM | POA: Diagnosis not present

## 2021-08-10 DIAGNOSIS — R52 Pain, unspecified: Secondary | ICD-10-CM | POA: Diagnosis not present

## 2021-08-10 DIAGNOSIS — M6259 Muscle wasting and atrophy, not elsewhere classified, multiple sites: Secondary | ICD-10-CM | POA: Diagnosis not present

## 2021-08-10 DIAGNOSIS — R296 Repeated falls: Secondary | ICD-10-CM | POA: Diagnosis not present

## 2021-08-10 DIAGNOSIS — E559 Vitamin D deficiency, unspecified: Secondary | ICD-10-CM | POA: Diagnosis not present

## 2021-08-10 DIAGNOSIS — F0390 Unspecified dementia without behavioral disturbance: Secondary | ICD-10-CM | POA: Diagnosis not present

## 2021-08-10 DIAGNOSIS — Z96652 Presence of left artificial knee joint: Secondary | ICD-10-CM | POA: Diagnosis not present

## 2021-08-11 DIAGNOSIS — R296 Repeated falls: Secondary | ICD-10-CM | POA: Diagnosis not present

## 2021-08-11 DIAGNOSIS — F02B0 Dementia in other diseases classified elsewhere, moderate, without behavioral disturbance, psychotic disturbance, mood disturbance, and anxiety: Secondary | ICD-10-CM | POA: Diagnosis not present

## 2021-08-11 DIAGNOSIS — Z471 Aftercare following joint replacement surgery: Secondary | ICD-10-CM | POA: Diagnosis not present

## 2021-08-11 DIAGNOSIS — G4701 Insomnia due to medical condition: Secondary | ICD-10-CM | POA: Diagnosis not present

## 2021-08-11 DIAGNOSIS — G300 Alzheimer's disease with early onset: Secondary | ICD-10-CM | POA: Diagnosis not present

## 2021-08-11 DIAGNOSIS — R52 Pain, unspecified: Secondary | ICD-10-CM | POA: Diagnosis not present

## 2021-08-11 DIAGNOSIS — R278 Other lack of coordination: Secondary | ICD-10-CM | POA: Diagnosis not present

## 2021-08-11 DIAGNOSIS — M6259 Muscle wasting and atrophy, not elsewhere classified, multiple sites: Secondary | ICD-10-CM | POA: Diagnosis not present

## 2021-08-11 DIAGNOSIS — Z96652 Presence of left artificial knee joint: Secondary | ICD-10-CM | POA: Diagnosis not present

## 2021-08-12 DIAGNOSIS — Z471 Aftercare following joint replacement surgery: Secondary | ICD-10-CM | POA: Diagnosis not present

## 2021-08-12 DIAGNOSIS — R278 Other lack of coordination: Secondary | ICD-10-CM | POA: Diagnosis not present

## 2021-08-12 DIAGNOSIS — R52 Pain, unspecified: Secondary | ICD-10-CM | POA: Diagnosis not present

## 2021-08-12 DIAGNOSIS — M6259 Muscle wasting and atrophy, not elsewhere classified, multiple sites: Secondary | ICD-10-CM | POA: Diagnosis not present

## 2021-08-12 DIAGNOSIS — Z96652 Presence of left artificial knee joint: Secondary | ICD-10-CM | POA: Diagnosis not present

## 2021-08-12 DIAGNOSIS — R296 Repeated falls: Secondary | ICD-10-CM | POA: Diagnosis not present

## 2021-08-15 DIAGNOSIS — R278 Other lack of coordination: Secondary | ICD-10-CM | POA: Diagnosis not present

## 2021-08-15 DIAGNOSIS — E559 Vitamin D deficiency, unspecified: Secondary | ICD-10-CM | POA: Diagnosis not present

## 2021-08-15 DIAGNOSIS — Z471 Aftercare following joint replacement surgery: Secondary | ICD-10-CM | POA: Diagnosis not present

## 2021-08-15 DIAGNOSIS — I10 Essential (primary) hypertension: Secondary | ICD-10-CM | POA: Diagnosis not present

## 2021-08-15 DIAGNOSIS — E876 Hypokalemia: Secondary | ICD-10-CM | POA: Diagnosis not present

## 2021-08-15 DIAGNOSIS — Z96652 Presence of left artificial knee joint: Secondary | ICD-10-CM | POA: Diagnosis not present

## 2021-08-15 DIAGNOSIS — M1712 Unilateral primary osteoarthritis, left knee: Secondary | ICD-10-CM | POA: Diagnosis not present

## 2021-08-15 DIAGNOSIS — E785 Hyperlipidemia, unspecified: Secondary | ICD-10-CM | POA: Diagnosis not present

## 2021-08-15 DIAGNOSIS — K5900 Constipation, unspecified: Secondary | ICD-10-CM | POA: Diagnosis not present

## 2021-08-15 DIAGNOSIS — R296 Repeated falls: Secondary | ICD-10-CM | POA: Diagnosis not present

## 2021-08-15 DIAGNOSIS — R52 Pain, unspecified: Secondary | ICD-10-CM | POA: Diagnosis not present

## 2021-08-15 DIAGNOSIS — D519 Vitamin B12 deficiency anemia, unspecified: Secondary | ICD-10-CM | POA: Diagnosis not present

## 2021-08-15 DIAGNOSIS — I2510 Atherosclerotic heart disease of native coronary artery without angina pectoris: Secondary | ICD-10-CM | POA: Diagnosis not present

## 2021-08-15 DIAGNOSIS — M810 Age-related osteoporosis without current pathological fracture: Secondary | ICD-10-CM | POA: Diagnosis not present

## 2021-08-15 DIAGNOSIS — R41841 Cognitive communication deficit: Secondary | ICD-10-CM | POA: Diagnosis not present

## 2021-08-15 DIAGNOSIS — F0390 Unspecified dementia without behavioral disturbance: Secondary | ICD-10-CM | POA: Diagnosis not present

## 2021-08-15 DIAGNOSIS — M6259 Muscle wasting and atrophy, not elsewhere classified, multiple sites: Secondary | ICD-10-CM | POA: Diagnosis not present

## 2021-08-16 DIAGNOSIS — Z96652 Presence of left artificial knee joint: Secondary | ICD-10-CM | POA: Diagnosis not present

## 2021-08-16 DIAGNOSIS — R52 Pain, unspecified: Secondary | ICD-10-CM | POA: Diagnosis not present

## 2021-08-16 DIAGNOSIS — R278 Other lack of coordination: Secondary | ICD-10-CM | POA: Diagnosis not present

## 2021-08-16 DIAGNOSIS — M6259 Muscle wasting and atrophy, not elsewhere classified, multiple sites: Secondary | ICD-10-CM | POA: Diagnosis not present

## 2021-08-16 DIAGNOSIS — Z471 Aftercare following joint replacement surgery: Secondary | ICD-10-CM | POA: Diagnosis not present

## 2021-08-16 DIAGNOSIS — R296 Repeated falls: Secondary | ICD-10-CM | POA: Diagnosis not present

## 2021-08-17 DIAGNOSIS — R296 Repeated falls: Secondary | ICD-10-CM | POA: Diagnosis not present

## 2021-08-17 DIAGNOSIS — Z96652 Presence of left artificial knee joint: Secondary | ICD-10-CM | POA: Diagnosis not present

## 2021-08-17 DIAGNOSIS — M6259 Muscle wasting and atrophy, not elsewhere classified, multiple sites: Secondary | ICD-10-CM | POA: Diagnosis not present

## 2021-08-17 DIAGNOSIS — Z471 Aftercare following joint replacement surgery: Secondary | ICD-10-CM | POA: Diagnosis not present

## 2021-08-17 DIAGNOSIS — R52 Pain, unspecified: Secondary | ICD-10-CM | POA: Diagnosis not present

## 2021-08-17 DIAGNOSIS — R278 Other lack of coordination: Secondary | ICD-10-CM | POA: Diagnosis not present

## 2021-09-01 DIAGNOSIS — R278 Other lack of coordination: Secondary | ICD-10-CM | POA: Diagnosis not present

## 2021-09-01 DIAGNOSIS — Z96652 Presence of left artificial knee joint: Secondary | ICD-10-CM | POA: Diagnosis not present

## 2021-09-01 DIAGNOSIS — M6259 Muscle wasting and atrophy, not elsewhere classified, multiple sites: Secondary | ICD-10-CM | POA: Diagnosis not present

## 2021-09-01 DIAGNOSIS — Z471 Aftercare following joint replacement surgery: Secondary | ICD-10-CM | POA: Diagnosis not present

## 2021-09-01 DIAGNOSIS — R296 Repeated falls: Secondary | ICD-10-CM | POA: Diagnosis not present

## 2021-09-01 DIAGNOSIS — R52 Pain, unspecified: Secondary | ICD-10-CM | POA: Diagnosis not present

## 2021-09-02 DIAGNOSIS — R296 Repeated falls: Secondary | ICD-10-CM | POA: Diagnosis not present

## 2021-09-02 DIAGNOSIS — M6259 Muscle wasting and atrophy, not elsewhere classified, multiple sites: Secondary | ICD-10-CM | POA: Diagnosis not present

## 2021-09-02 DIAGNOSIS — R278 Other lack of coordination: Secondary | ICD-10-CM | POA: Diagnosis not present

## 2021-09-02 DIAGNOSIS — R52 Pain, unspecified: Secondary | ICD-10-CM | POA: Diagnosis not present

## 2021-09-02 DIAGNOSIS — Z96652 Presence of left artificial knee joint: Secondary | ICD-10-CM | POA: Diagnosis not present

## 2021-09-02 DIAGNOSIS — Z471 Aftercare following joint replacement surgery: Secondary | ICD-10-CM | POA: Diagnosis not present

## 2021-09-04 DIAGNOSIS — Z96652 Presence of left artificial knee joint: Secondary | ICD-10-CM | POA: Diagnosis not present

## 2021-09-04 DIAGNOSIS — R52 Pain, unspecified: Secondary | ICD-10-CM | POA: Diagnosis not present

## 2021-09-04 DIAGNOSIS — M6259 Muscle wasting and atrophy, not elsewhere classified, multiple sites: Secondary | ICD-10-CM | POA: Diagnosis not present

## 2021-09-04 DIAGNOSIS — R278 Other lack of coordination: Secondary | ICD-10-CM | POA: Diagnosis not present

## 2021-09-04 DIAGNOSIS — R296 Repeated falls: Secondary | ICD-10-CM | POA: Diagnosis not present

## 2021-09-04 DIAGNOSIS — Z471 Aftercare following joint replacement surgery: Secondary | ICD-10-CM | POA: Diagnosis not present

## 2021-09-06 DIAGNOSIS — Z471 Aftercare following joint replacement surgery: Secondary | ICD-10-CM | POA: Diagnosis not present

## 2021-09-06 DIAGNOSIS — R278 Other lack of coordination: Secondary | ICD-10-CM | POA: Diagnosis not present

## 2021-09-06 DIAGNOSIS — Z96652 Presence of left artificial knee joint: Secondary | ICD-10-CM | POA: Diagnosis not present

## 2021-09-06 DIAGNOSIS — R52 Pain, unspecified: Secondary | ICD-10-CM | POA: Diagnosis not present

## 2021-09-06 DIAGNOSIS — R296 Repeated falls: Secondary | ICD-10-CM | POA: Diagnosis not present

## 2021-09-06 DIAGNOSIS — M6259 Muscle wasting and atrophy, not elsewhere classified, multiple sites: Secondary | ICD-10-CM | POA: Diagnosis not present

## 2021-09-07 DIAGNOSIS — M6259 Muscle wasting and atrophy, not elsewhere classified, multiple sites: Secondary | ICD-10-CM | POA: Diagnosis not present

## 2021-09-07 DIAGNOSIS — R278 Other lack of coordination: Secondary | ICD-10-CM | POA: Diagnosis not present

## 2021-09-07 DIAGNOSIS — R52 Pain, unspecified: Secondary | ICD-10-CM | POA: Diagnosis not present

## 2021-09-07 DIAGNOSIS — Z96652 Presence of left artificial knee joint: Secondary | ICD-10-CM | POA: Diagnosis not present

## 2021-09-07 DIAGNOSIS — I10 Essential (primary) hypertension: Secondary | ICD-10-CM | POA: Diagnosis not present

## 2021-09-07 DIAGNOSIS — G309 Alzheimer's disease, unspecified: Secondary | ICD-10-CM | POA: Diagnosis not present

## 2021-09-07 DIAGNOSIS — E559 Vitamin D deficiency, unspecified: Secondary | ICD-10-CM | POA: Diagnosis not present

## 2021-09-07 DIAGNOSIS — R296 Repeated falls: Secondary | ICD-10-CM | POA: Diagnosis not present

## 2021-09-07 DIAGNOSIS — Z471 Aftercare following joint replacement surgery: Secondary | ICD-10-CM | POA: Diagnosis not present

## 2021-09-08 DIAGNOSIS — F02B0 Dementia in other diseases classified elsewhere, moderate, without behavioral disturbance, psychotic disturbance, mood disturbance, and anxiety: Secondary | ICD-10-CM | POA: Diagnosis not present

## 2021-09-08 DIAGNOSIS — G300 Alzheimer's disease with early onset: Secondary | ICD-10-CM | POA: Diagnosis not present

## 2021-09-08 DIAGNOSIS — Z96652 Presence of left artificial knee joint: Secondary | ICD-10-CM | POA: Diagnosis not present

## 2021-09-08 DIAGNOSIS — R296 Repeated falls: Secondary | ICD-10-CM | POA: Diagnosis not present

## 2021-09-08 DIAGNOSIS — R52 Pain, unspecified: Secondary | ICD-10-CM | POA: Diagnosis not present

## 2021-09-08 DIAGNOSIS — M6259 Muscle wasting and atrophy, not elsewhere classified, multiple sites: Secondary | ICD-10-CM | POA: Diagnosis not present

## 2021-09-08 DIAGNOSIS — Z471 Aftercare following joint replacement surgery: Secondary | ICD-10-CM | POA: Diagnosis not present

## 2021-09-08 DIAGNOSIS — R278 Other lack of coordination: Secondary | ICD-10-CM | POA: Diagnosis not present

## 2021-09-08 DIAGNOSIS — G4701 Insomnia due to medical condition: Secondary | ICD-10-CM | POA: Diagnosis not present

## 2021-09-09 DIAGNOSIS — Z96652 Presence of left artificial knee joint: Secondary | ICD-10-CM | POA: Diagnosis not present

## 2021-09-09 DIAGNOSIS — M6259 Muscle wasting and atrophy, not elsewhere classified, multiple sites: Secondary | ICD-10-CM | POA: Diagnosis not present

## 2021-09-09 DIAGNOSIS — R278 Other lack of coordination: Secondary | ICD-10-CM | POA: Diagnosis not present

## 2021-09-09 DIAGNOSIS — Z471 Aftercare following joint replacement surgery: Secondary | ICD-10-CM | POA: Diagnosis not present

## 2021-09-09 DIAGNOSIS — R296 Repeated falls: Secondary | ICD-10-CM | POA: Diagnosis not present

## 2021-09-09 DIAGNOSIS — R52 Pain, unspecified: Secondary | ICD-10-CM | POA: Diagnosis not present

## 2021-09-12 DIAGNOSIS — D519 Vitamin B12 deficiency anemia, unspecified: Secondary | ICD-10-CM | POA: Diagnosis not present

## 2021-09-12 DIAGNOSIS — R52 Pain, unspecified: Secondary | ICD-10-CM | POA: Diagnosis not present

## 2021-09-12 DIAGNOSIS — F0390 Unspecified dementia without behavioral disturbance: Secondary | ICD-10-CM | POA: Diagnosis not present

## 2021-09-12 DIAGNOSIS — E876 Hypokalemia: Secondary | ICD-10-CM | POA: Diagnosis not present

## 2021-09-12 DIAGNOSIS — K5900 Constipation, unspecified: Secondary | ICD-10-CM | POA: Diagnosis not present

## 2021-09-12 DIAGNOSIS — R278 Other lack of coordination: Secondary | ICD-10-CM | POA: Diagnosis not present

## 2021-09-12 DIAGNOSIS — E785 Hyperlipidemia, unspecified: Secondary | ICD-10-CM | POA: Diagnosis not present

## 2021-09-12 DIAGNOSIS — M6259 Muscle wasting and atrophy, not elsewhere classified, multiple sites: Secondary | ICD-10-CM | POA: Diagnosis not present

## 2021-09-12 DIAGNOSIS — R296 Repeated falls: Secondary | ICD-10-CM | POA: Diagnosis not present

## 2021-09-12 DIAGNOSIS — I2510 Atherosclerotic heart disease of native coronary artery without angina pectoris: Secondary | ICD-10-CM | POA: Diagnosis not present

## 2021-09-12 DIAGNOSIS — E559 Vitamin D deficiency, unspecified: Secondary | ICD-10-CM | POA: Diagnosis not present

## 2021-09-12 DIAGNOSIS — F03911 Unspecified dementia, unspecified severity, with agitation: Secondary | ICD-10-CM | POA: Diagnosis not present

## 2021-09-12 DIAGNOSIS — Z736 Limitation of activities due to disability: Secondary | ICD-10-CM | POA: Diagnosis not present

## 2021-09-12 DIAGNOSIS — M810 Age-related osteoporosis without current pathological fracture: Secondary | ICD-10-CM | POA: Diagnosis not present

## 2021-09-12 DIAGNOSIS — I10 Essential (primary) hypertension: Secondary | ICD-10-CM | POA: Diagnosis not present

## 2021-09-12 DIAGNOSIS — Z96652 Presence of left artificial knee joint: Secondary | ICD-10-CM | POA: Diagnosis not present

## 2021-09-12 DIAGNOSIS — R41841 Cognitive communication deficit: Secondary | ICD-10-CM | POA: Diagnosis not present

## 2021-09-13 DIAGNOSIS — R52 Pain, unspecified: Secondary | ICD-10-CM | POA: Diagnosis not present

## 2021-09-13 DIAGNOSIS — R41841 Cognitive communication deficit: Secondary | ICD-10-CM | POA: Diagnosis not present

## 2021-09-13 DIAGNOSIS — M6259 Muscle wasting and atrophy, not elsewhere classified, multiple sites: Secondary | ICD-10-CM | POA: Diagnosis not present

## 2021-09-13 DIAGNOSIS — Z96652 Presence of left artificial knee joint: Secondary | ICD-10-CM | POA: Diagnosis not present

## 2021-09-13 DIAGNOSIS — Z736 Limitation of activities due to disability: Secondary | ICD-10-CM | POA: Diagnosis not present

## 2021-09-13 DIAGNOSIS — R296 Repeated falls: Secondary | ICD-10-CM | POA: Diagnosis not present

## 2021-09-14 DIAGNOSIS — R41841 Cognitive communication deficit: Secondary | ICD-10-CM | POA: Diagnosis not present

## 2021-09-14 DIAGNOSIS — Z96652 Presence of left artificial knee joint: Secondary | ICD-10-CM | POA: Diagnosis not present

## 2021-09-14 DIAGNOSIS — Z736 Limitation of activities due to disability: Secondary | ICD-10-CM | POA: Diagnosis not present

## 2021-09-14 DIAGNOSIS — R296 Repeated falls: Secondary | ICD-10-CM | POA: Diagnosis not present

## 2021-09-14 DIAGNOSIS — R52 Pain, unspecified: Secondary | ICD-10-CM | POA: Diagnosis not present

## 2021-09-14 DIAGNOSIS — M6259 Muscle wasting and atrophy, not elsewhere classified, multiple sites: Secondary | ICD-10-CM | POA: Diagnosis not present

## 2021-09-15 DIAGNOSIS — M6259 Muscle wasting and atrophy, not elsewhere classified, multiple sites: Secondary | ICD-10-CM | POA: Diagnosis not present

## 2021-09-15 DIAGNOSIS — Z96652 Presence of left artificial knee joint: Secondary | ICD-10-CM | POA: Diagnosis not present

## 2021-09-15 DIAGNOSIS — R52 Pain, unspecified: Secondary | ICD-10-CM | POA: Diagnosis not present

## 2021-09-15 DIAGNOSIS — R41841 Cognitive communication deficit: Secondary | ICD-10-CM | POA: Diagnosis not present

## 2021-09-15 DIAGNOSIS — Z736 Limitation of activities due to disability: Secondary | ICD-10-CM | POA: Diagnosis not present

## 2021-09-15 DIAGNOSIS — R296 Repeated falls: Secondary | ICD-10-CM | POA: Diagnosis not present

## 2021-09-16 DIAGNOSIS — Z736 Limitation of activities due to disability: Secondary | ICD-10-CM | POA: Diagnosis not present

## 2021-09-16 DIAGNOSIS — Z96652 Presence of left artificial knee joint: Secondary | ICD-10-CM | POA: Diagnosis not present

## 2021-09-16 DIAGNOSIS — M6259 Muscle wasting and atrophy, not elsewhere classified, multiple sites: Secondary | ICD-10-CM | POA: Diagnosis not present

## 2021-09-16 DIAGNOSIS — R52 Pain, unspecified: Secondary | ICD-10-CM | POA: Diagnosis not present

## 2021-09-16 DIAGNOSIS — R296 Repeated falls: Secondary | ICD-10-CM | POA: Diagnosis not present

## 2021-09-16 DIAGNOSIS — R41841 Cognitive communication deficit: Secondary | ICD-10-CM | POA: Diagnosis not present

## 2021-09-19 DIAGNOSIS — Z96652 Presence of left artificial knee joint: Secondary | ICD-10-CM | POA: Diagnosis not present

## 2021-09-19 DIAGNOSIS — R41841 Cognitive communication deficit: Secondary | ICD-10-CM | POA: Diagnosis not present

## 2021-09-19 DIAGNOSIS — Z736 Limitation of activities due to disability: Secondary | ICD-10-CM | POA: Diagnosis not present

## 2021-09-19 DIAGNOSIS — M6259 Muscle wasting and atrophy, not elsewhere classified, multiple sites: Secondary | ICD-10-CM | POA: Diagnosis not present

## 2021-09-19 DIAGNOSIS — R52 Pain, unspecified: Secondary | ICD-10-CM | POA: Diagnosis not present

## 2021-09-19 DIAGNOSIS — R296 Repeated falls: Secondary | ICD-10-CM | POA: Diagnosis not present

## 2021-09-20 DIAGNOSIS — Z736 Limitation of activities due to disability: Secondary | ICD-10-CM | POA: Diagnosis not present

## 2021-09-20 DIAGNOSIS — R296 Repeated falls: Secondary | ICD-10-CM | POA: Diagnosis not present

## 2021-09-20 DIAGNOSIS — M6259 Muscle wasting and atrophy, not elsewhere classified, multiple sites: Secondary | ICD-10-CM | POA: Diagnosis not present

## 2021-09-20 DIAGNOSIS — R52 Pain, unspecified: Secondary | ICD-10-CM | POA: Diagnosis not present

## 2021-09-20 DIAGNOSIS — Z96652 Presence of left artificial knee joint: Secondary | ICD-10-CM | POA: Diagnosis not present

## 2021-09-20 DIAGNOSIS — R41841 Cognitive communication deficit: Secondary | ICD-10-CM | POA: Diagnosis not present

## 2021-09-21 DIAGNOSIS — Z736 Limitation of activities due to disability: Secondary | ICD-10-CM | POA: Diagnosis not present

## 2021-09-21 DIAGNOSIS — Z96652 Presence of left artificial knee joint: Secondary | ICD-10-CM | POA: Diagnosis not present

## 2021-09-21 DIAGNOSIS — R296 Repeated falls: Secondary | ICD-10-CM | POA: Diagnosis not present

## 2021-09-21 DIAGNOSIS — R41841 Cognitive communication deficit: Secondary | ICD-10-CM | POA: Diagnosis not present

## 2021-09-21 DIAGNOSIS — M6259 Muscle wasting and atrophy, not elsewhere classified, multiple sites: Secondary | ICD-10-CM | POA: Diagnosis not present

## 2021-09-21 DIAGNOSIS — R52 Pain, unspecified: Secondary | ICD-10-CM | POA: Diagnosis not present

## 2021-09-22 DIAGNOSIS — Z96652 Presence of left artificial knee joint: Secondary | ICD-10-CM | POA: Diagnosis not present

## 2021-09-22 DIAGNOSIS — R41841 Cognitive communication deficit: Secondary | ICD-10-CM | POA: Diagnosis not present

## 2021-09-22 DIAGNOSIS — R296 Repeated falls: Secondary | ICD-10-CM | POA: Diagnosis not present

## 2021-09-22 DIAGNOSIS — R52 Pain, unspecified: Secondary | ICD-10-CM | POA: Diagnosis not present

## 2021-09-22 DIAGNOSIS — Z736 Limitation of activities due to disability: Secondary | ICD-10-CM | POA: Diagnosis not present

## 2021-09-22 DIAGNOSIS — M6259 Muscle wasting and atrophy, not elsewhere classified, multiple sites: Secondary | ICD-10-CM | POA: Diagnosis not present

## 2021-09-23 DIAGNOSIS — G309 Alzheimer's disease, unspecified: Secondary | ICD-10-CM | POA: Diagnosis not present

## 2021-09-23 DIAGNOSIS — R41841 Cognitive communication deficit: Secondary | ICD-10-CM | POA: Diagnosis not present

## 2021-09-23 DIAGNOSIS — R52 Pain, unspecified: Secondary | ICD-10-CM | POA: Diagnosis not present

## 2021-09-23 DIAGNOSIS — Z954 Presence of other heart-valve replacement: Secondary | ICD-10-CM | POA: Diagnosis not present

## 2021-09-23 DIAGNOSIS — F0280 Dementia in other diseases classified elsewhere without behavioral disturbance: Secondary | ICD-10-CM | POA: Diagnosis not present

## 2021-09-23 DIAGNOSIS — M6259 Muscle wasting and atrophy, not elsewhere classified, multiple sites: Secondary | ICD-10-CM | POA: Diagnosis not present

## 2021-09-23 DIAGNOSIS — I2510 Atherosclerotic heart disease of native coronary artery without angina pectoris: Secondary | ICD-10-CM | POA: Diagnosis not present

## 2021-09-23 DIAGNOSIS — Z96652 Presence of left artificial knee joint: Secondary | ICD-10-CM | POA: Diagnosis not present

## 2021-09-23 DIAGNOSIS — R296 Repeated falls: Secondary | ICD-10-CM | POA: Diagnosis not present

## 2021-09-23 DIAGNOSIS — Z736 Limitation of activities due to disability: Secondary | ICD-10-CM | POA: Diagnosis not present

## 2021-09-23 DIAGNOSIS — Z951 Presence of aortocoronary bypass graft: Secondary | ICD-10-CM | POA: Diagnosis not present

## 2021-09-23 DIAGNOSIS — I480 Paroxysmal atrial fibrillation: Secondary | ICD-10-CM | POA: Diagnosis not present

## 2021-09-23 DIAGNOSIS — I050 Rheumatic mitral stenosis: Secondary | ICD-10-CM | POA: Diagnosis not present

## 2021-09-23 DIAGNOSIS — I10 Essential (primary) hypertension: Secondary | ICD-10-CM | POA: Diagnosis not present

## 2021-09-23 DIAGNOSIS — Z952 Presence of prosthetic heart valve: Secondary | ICD-10-CM | POA: Diagnosis not present

## 2021-09-26 DIAGNOSIS — M6259 Muscle wasting and atrophy, not elsewhere classified, multiple sites: Secondary | ICD-10-CM | POA: Diagnosis not present

## 2021-09-26 DIAGNOSIS — Z96652 Presence of left artificial knee joint: Secondary | ICD-10-CM | POA: Diagnosis not present

## 2021-09-26 DIAGNOSIS — R52 Pain, unspecified: Secondary | ICD-10-CM | POA: Diagnosis not present

## 2021-09-26 DIAGNOSIS — R296 Repeated falls: Secondary | ICD-10-CM | POA: Diagnosis not present

## 2021-09-26 DIAGNOSIS — Z736 Limitation of activities due to disability: Secondary | ICD-10-CM | POA: Diagnosis not present

## 2021-09-26 DIAGNOSIS — R41841 Cognitive communication deficit: Secondary | ICD-10-CM | POA: Diagnosis not present

## 2021-09-27 DIAGNOSIS — R296 Repeated falls: Secondary | ICD-10-CM | POA: Diagnosis not present

## 2021-09-27 DIAGNOSIS — Z96652 Presence of left artificial knee joint: Secondary | ICD-10-CM | POA: Diagnosis not present

## 2021-09-27 DIAGNOSIS — M6259 Muscle wasting and atrophy, not elsewhere classified, multiple sites: Secondary | ICD-10-CM | POA: Diagnosis not present

## 2021-09-27 DIAGNOSIS — R52 Pain, unspecified: Secondary | ICD-10-CM | POA: Diagnosis not present

## 2021-09-27 DIAGNOSIS — R41841 Cognitive communication deficit: Secondary | ICD-10-CM | POA: Diagnosis not present

## 2021-09-27 DIAGNOSIS — Z736 Limitation of activities due to disability: Secondary | ICD-10-CM | POA: Diagnosis not present

## 2021-09-28 DIAGNOSIS — M6259 Muscle wasting and atrophy, not elsewhere classified, multiple sites: Secondary | ICD-10-CM | POA: Diagnosis not present

## 2021-09-28 DIAGNOSIS — R41841 Cognitive communication deficit: Secondary | ICD-10-CM | POA: Diagnosis not present

## 2021-09-28 DIAGNOSIS — R52 Pain, unspecified: Secondary | ICD-10-CM | POA: Diagnosis not present

## 2021-09-28 DIAGNOSIS — Z736 Limitation of activities due to disability: Secondary | ICD-10-CM | POA: Diagnosis not present

## 2021-09-28 DIAGNOSIS — Z96652 Presence of left artificial knee joint: Secondary | ICD-10-CM | POA: Diagnosis not present

## 2021-09-28 DIAGNOSIS — R296 Repeated falls: Secondary | ICD-10-CM | POA: Diagnosis not present

## 2021-09-29 DIAGNOSIS — Z736 Limitation of activities due to disability: Secondary | ICD-10-CM | POA: Diagnosis not present

## 2021-09-29 DIAGNOSIS — R41841 Cognitive communication deficit: Secondary | ICD-10-CM | POA: Diagnosis not present

## 2021-09-29 DIAGNOSIS — M6259 Muscle wasting and atrophy, not elsewhere classified, multiple sites: Secondary | ICD-10-CM | POA: Diagnosis not present

## 2021-09-29 DIAGNOSIS — R52 Pain, unspecified: Secondary | ICD-10-CM | POA: Diagnosis not present

## 2021-09-29 DIAGNOSIS — N390 Urinary tract infection, site not specified: Secondary | ICD-10-CM | POA: Diagnosis not present

## 2021-09-29 DIAGNOSIS — Z96652 Presence of left artificial knee joint: Secondary | ICD-10-CM | POA: Diagnosis not present

## 2021-09-29 DIAGNOSIS — R296 Repeated falls: Secondary | ICD-10-CM | POA: Diagnosis not present

## 2021-09-30 DIAGNOSIS — R41841 Cognitive communication deficit: Secondary | ICD-10-CM | POA: Diagnosis not present

## 2021-09-30 DIAGNOSIS — Z96652 Presence of left artificial knee joint: Secondary | ICD-10-CM | POA: Diagnosis not present

## 2021-09-30 DIAGNOSIS — R52 Pain, unspecified: Secondary | ICD-10-CM | POA: Diagnosis not present

## 2021-09-30 DIAGNOSIS — R296 Repeated falls: Secondary | ICD-10-CM | POA: Diagnosis not present

## 2021-09-30 DIAGNOSIS — M6259 Muscle wasting and atrophy, not elsewhere classified, multiple sites: Secondary | ICD-10-CM | POA: Diagnosis not present

## 2021-09-30 DIAGNOSIS — Z736 Limitation of activities due to disability: Secondary | ICD-10-CM | POA: Diagnosis not present

## 2021-10-03 DIAGNOSIS — R296 Repeated falls: Secondary | ICD-10-CM | POA: Diagnosis not present

## 2021-10-03 DIAGNOSIS — R52 Pain, unspecified: Secondary | ICD-10-CM | POA: Diagnosis not present

## 2021-10-03 DIAGNOSIS — R41841 Cognitive communication deficit: Secondary | ICD-10-CM | POA: Diagnosis not present

## 2021-10-03 DIAGNOSIS — M6259 Muscle wasting and atrophy, not elsewhere classified, multiple sites: Secondary | ICD-10-CM | POA: Diagnosis not present

## 2021-10-03 DIAGNOSIS — Z96652 Presence of left artificial knee joint: Secondary | ICD-10-CM | POA: Diagnosis not present

## 2021-10-03 DIAGNOSIS — Z736 Limitation of activities due to disability: Secondary | ICD-10-CM | POA: Diagnosis not present

## 2021-10-04 DIAGNOSIS — R296 Repeated falls: Secondary | ICD-10-CM | POA: Diagnosis not present

## 2021-10-04 DIAGNOSIS — R41841 Cognitive communication deficit: Secondary | ICD-10-CM | POA: Diagnosis not present

## 2021-10-04 DIAGNOSIS — R52 Pain, unspecified: Secondary | ICD-10-CM | POA: Diagnosis not present

## 2021-10-04 DIAGNOSIS — Z736 Limitation of activities due to disability: Secondary | ICD-10-CM | POA: Diagnosis not present

## 2021-10-04 DIAGNOSIS — M6259 Muscle wasting and atrophy, not elsewhere classified, multiple sites: Secondary | ICD-10-CM | POA: Diagnosis not present

## 2021-10-04 DIAGNOSIS — Z96652 Presence of left artificial knee joint: Secondary | ICD-10-CM | POA: Diagnosis not present

## 2021-10-05 DIAGNOSIS — I10 Essential (primary) hypertension: Secondary | ICD-10-CM | POA: Diagnosis not present

## 2021-10-05 DIAGNOSIS — N390 Urinary tract infection, site not specified: Secondary | ICD-10-CM | POA: Diagnosis not present

## 2021-10-06 DIAGNOSIS — G300 Alzheimer's disease with early onset: Secondary | ICD-10-CM | POA: Diagnosis not present

## 2021-10-06 DIAGNOSIS — G4701 Insomnia due to medical condition: Secondary | ICD-10-CM | POA: Diagnosis not present

## 2021-10-06 DIAGNOSIS — F02B0 Dementia in other diseases classified elsewhere, moderate, without behavioral disturbance, psychotic disturbance, mood disturbance, and anxiety: Secondary | ICD-10-CM | POA: Diagnosis not present

## 2021-10-21 DIAGNOSIS — I10 Essential (primary) hypertension: Secondary | ICD-10-CM | POA: Diagnosis not present

## 2021-11-01 DIAGNOSIS — E119 Type 2 diabetes mellitus without complications: Secondary | ICD-10-CM | POA: Diagnosis not present

## 2021-11-10 DIAGNOSIS — I10 Essential (primary) hypertension: Secondary | ICD-10-CM | POA: Diagnosis not present

## 2021-11-10 DIAGNOSIS — G4700 Insomnia, unspecified: Secondary | ICD-10-CM | POA: Diagnosis not present

## 2021-11-10 DIAGNOSIS — M6281 Muscle weakness (generalized): Secondary | ICD-10-CM | POA: Diagnosis not present

## 2021-11-10 DIAGNOSIS — G309 Alzheimer's disease, unspecified: Secondary | ICD-10-CM | POA: Diagnosis not present

## 2021-11-17 DIAGNOSIS — F02B0 Dementia in other diseases classified elsewhere, moderate, without behavioral disturbance, psychotic disturbance, mood disturbance, and anxiety: Secondary | ICD-10-CM | POA: Diagnosis not present

## 2021-11-17 DIAGNOSIS — G300 Alzheimer's disease with early onset: Secondary | ICD-10-CM | POA: Diagnosis not present

## 2021-11-17 DIAGNOSIS — G4701 Insomnia due to medical condition: Secondary | ICD-10-CM | POA: Diagnosis not present

## 2021-12-02 DIAGNOSIS — E876 Hypokalemia: Secondary | ICD-10-CM | POA: Diagnosis not present

## 2021-12-02 DIAGNOSIS — K5900 Constipation, unspecified: Secondary | ICD-10-CM | POA: Diagnosis not present

## 2021-12-02 DIAGNOSIS — R278 Other lack of coordination: Secondary | ICD-10-CM | POA: Diagnosis not present

## 2021-12-02 DIAGNOSIS — R52 Pain, unspecified: Secondary | ICD-10-CM | POA: Diagnosis not present

## 2021-12-02 DIAGNOSIS — E559 Vitamin D deficiency, unspecified: Secondary | ICD-10-CM | POA: Diagnosis not present

## 2021-12-02 DIAGNOSIS — F03911 Unspecified dementia, unspecified severity, with agitation: Secondary | ICD-10-CM | POA: Diagnosis not present

## 2021-12-02 DIAGNOSIS — I2510 Atherosclerotic heart disease of native coronary artery without angina pectoris: Secondary | ICD-10-CM | POA: Diagnosis not present

## 2021-12-02 DIAGNOSIS — E785 Hyperlipidemia, unspecified: Secondary | ICD-10-CM | POA: Diagnosis not present

## 2021-12-02 DIAGNOSIS — Z96652 Presence of left artificial knee joint: Secondary | ICD-10-CM | POA: Diagnosis not present

## 2021-12-02 DIAGNOSIS — R41841 Cognitive communication deficit: Secondary | ICD-10-CM | POA: Diagnosis not present

## 2021-12-02 DIAGNOSIS — F0390 Unspecified dementia without behavioral disturbance: Secondary | ICD-10-CM | POA: Diagnosis not present

## 2021-12-02 DIAGNOSIS — I10 Essential (primary) hypertension: Secondary | ICD-10-CM | POA: Diagnosis not present

## 2021-12-02 DIAGNOSIS — M810 Age-related osteoporosis without current pathological fracture: Secondary | ICD-10-CM | POA: Diagnosis not present

## 2021-12-02 DIAGNOSIS — M6259 Muscle wasting and atrophy, not elsewhere classified, multiple sites: Secondary | ICD-10-CM | POA: Diagnosis not present

## 2021-12-02 DIAGNOSIS — R296 Repeated falls: Secondary | ICD-10-CM | POA: Diagnosis not present

## 2021-12-02 DIAGNOSIS — D519 Vitamin B12 deficiency anemia, unspecified: Secondary | ICD-10-CM | POA: Diagnosis not present

## 2021-12-02 DIAGNOSIS — Z736 Limitation of activities due to disability: Secondary | ICD-10-CM | POA: Diagnosis not present

## 2021-12-05 DIAGNOSIS — R52 Pain, unspecified: Secondary | ICD-10-CM | POA: Diagnosis not present

## 2021-12-05 DIAGNOSIS — R41841 Cognitive communication deficit: Secondary | ICD-10-CM | POA: Diagnosis not present

## 2021-12-05 DIAGNOSIS — Z96652 Presence of left artificial knee joint: Secondary | ICD-10-CM | POA: Diagnosis not present

## 2021-12-05 DIAGNOSIS — M6259 Muscle wasting and atrophy, not elsewhere classified, multiple sites: Secondary | ICD-10-CM | POA: Diagnosis not present

## 2021-12-05 DIAGNOSIS — R296 Repeated falls: Secondary | ICD-10-CM | POA: Diagnosis not present

## 2021-12-05 DIAGNOSIS — Z736 Limitation of activities due to disability: Secondary | ICD-10-CM | POA: Diagnosis not present

## 2021-12-06 DIAGNOSIS — M6259 Muscle wasting and atrophy, not elsewhere classified, multiple sites: Secondary | ICD-10-CM | POA: Diagnosis not present

## 2021-12-06 DIAGNOSIS — R296 Repeated falls: Secondary | ICD-10-CM | POA: Diagnosis not present

## 2021-12-06 DIAGNOSIS — Z736 Limitation of activities due to disability: Secondary | ICD-10-CM | POA: Diagnosis not present

## 2021-12-06 DIAGNOSIS — R52 Pain, unspecified: Secondary | ICD-10-CM | POA: Diagnosis not present

## 2021-12-06 DIAGNOSIS — R41841 Cognitive communication deficit: Secondary | ICD-10-CM | POA: Diagnosis not present

## 2021-12-06 DIAGNOSIS — Z96652 Presence of left artificial knee joint: Secondary | ICD-10-CM | POA: Diagnosis not present

## 2021-12-07 DIAGNOSIS — R41841 Cognitive communication deficit: Secondary | ICD-10-CM | POA: Diagnosis not present

## 2021-12-07 DIAGNOSIS — M6259 Muscle wasting and atrophy, not elsewhere classified, multiple sites: Secondary | ICD-10-CM | POA: Diagnosis not present

## 2021-12-07 DIAGNOSIS — R296 Repeated falls: Secondary | ICD-10-CM | POA: Diagnosis not present

## 2021-12-07 DIAGNOSIS — Z736 Limitation of activities due to disability: Secondary | ICD-10-CM | POA: Diagnosis not present

## 2021-12-07 DIAGNOSIS — R52 Pain, unspecified: Secondary | ICD-10-CM | POA: Diagnosis not present

## 2021-12-07 DIAGNOSIS — Z96652 Presence of left artificial knee joint: Secondary | ICD-10-CM | POA: Diagnosis not present

## 2021-12-08 DIAGNOSIS — R296 Repeated falls: Secondary | ICD-10-CM | POA: Diagnosis not present

## 2021-12-08 DIAGNOSIS — R52 Pain, unspecified: Secondary | ICD-10-CM | POA: Diagnosis not present

## 2021-12-08 DIAGNOSIS — M6259 Muscle wasting and atrophy, not elsewhere classified, multiple sites: Secondary | ICD-10-CM | POA: Diagnosis not present

## 2021-12-08 DIAGNOSIS — Z736 Limitation of activities due to disability: Secondary | ICD-10-CM | POA: Diagnosis not present

## 2021-12-08 DIAGNOSIS — Z96652 Presence of left artificial knee joint: Secondary | ICD-10-CM | POA: Diagnosis not present

## 2021-12-08 DIAGNOSIS — R41841 Cognitive communication deficit: Secondary | ICD-10-CM | POA: Diagnosis not present

## 2021-12-09 DIAGNOSIS — R296 Repeated falls: Secondary | ICD-10-CM | POA: Diagnosis not present

## 2021-12-09 DIAGNOSIS — M6259 Muscle wasting and atrophy, not elsewhere classified, multiple sites: Secondary | ICD-10-CM | POA: Diagnosis not present

## 2021-12-09 DIAGNOSIS — R41841 Cognitive communication deficit: Secondary | ICD-10-CM | POA: Diagnosis not present

## 2021-12-09 DIAGNOSIS — R52 Pain, unspecified: Secondary | ICD-10-CM | POA: Diagnosis not present

## 2021-12-09 DIAGNOSIS — Z96652 Presence of left artificial knee joint: Secondary | ICD-10-CM | POA: Diagnosis not present

## 2021-12-09 DIAGNOSIS — Z736 Limitation of activities due to disability: Secondary | ICD-10-CM | POA: Diagnosis not present

## 2021-12-10 DIAGNOSIS — Z736 Limitation of activities due to disability: Secondary | ICD-10-CM | POA: Diagnosis not present

## 2021-12-10 DIAGNOSIS — Z96652 Presence of left artificial knee joint: Secondary | ICD-10-CM | POA: Diagnosis not present

## 2021-12-10 DIAGNOSIS — M6259 Muscle wasting and atrophy, not elsewhere classified, multiple sites: Secondary | ICD-10-CM | POA: Diagnosis not present

## 2021-12-10 DIAGNOSIS — R52 Pain, unspecified: Secondary | ICD-10-CM | POA: Diagnosis not present

## 2021-12-10 DIAGNOSIS — R296 Repeated falls: Secondary | ICD-10-CM | POA: Diagnosis not present

## 2021-12-10 DIAGNOSIS — R41841 Cognitive communication deficit: Secondary | ICD-10-CM | POA: Diagnosis not present

## 2021-12-12 DIAGNOSIS — R41841 Cognitive communication deficit: Secondary | ICD-10-CM | POA: Diagnosis not present

## 2021-12-12 DIAGNOSIS — R52 Pain, unspecified: Secondary | ICD-10-CM | POA: Diagnosis not present

## 2021-12-12 DIAGNOSIS — Z96652 Presence of left artificial knee joint: Secondary | ICD-10-CM | POA: Diagnosis not present

## 2021-12-12 DIAGNOSIS — M6259 Muscle wasting and atrophy, not elsewhere classified, multiple sites: Secondary | ICD-10-CM | POA: Diagnosis not present

## 2021-12-12 DIAGNOSIS — Z736 Limitation of activities due to disability: Secondary | ICD-10-CM | POA: Diagnosis not present

## 2021-12-12 DIAGNOSIS — R296 Repeated falls: Secondary | ICD-10-CM | POA: Diagnosis not present

## 2021-12-13 DIAGNOSIS — D519 Vitamin B12 deficiency anemia, unspecified: Secondary | ICD-10-CM | POA: Diagnosis not present

## 2021-12-13 DIAGNOSIS — M6259 Muscle wasting and atrophy, not elsewhere classified, multiple sites: Secondary | ICD-10-CM | POA: Diagnosis not present

## 2021-12-13 DIAGNOSIS — M810 Age-related osteoporosis without current pathological fracture: Secondary | ICD-10-CM | POA: Diagnosis not present

## 2021-12-13 DIAGNOSIS — R41841 Cognitive communication deficit: Secondary | ICD-10-CM | POA: Diagnosis not present

## 2021-12-13 DIAGNOSIS — Z736 Limitation of activities due to disability: Secondary | ICD-10-CM | POA: Diagnosis not present

## 2021-12-13 DIAGNOSIS — Z96652 Presence of left artificial knee joint: Secondary | ICD-10-CM | POA: Diagnosis not present

## 2021-12-13 DIAGNOSIS — R296 Repeated falls: Secondary | ICD-10-CM | POA: Diagnosis not present

## 2021-12-13 DIAGNOSIS — R278 Other lack of coordination: Secondary | ICD-10-CM | POA: Diagnosis not present

## 2021-12-13 DIAGNOSIS — I2510 Atherosclerotic heart disease of native coronary artery without angina pectoris: Secondary | ICD-10-CM | POA: Diagnosis not present

## 2021-12-13 DIAGNOSIS — K5900 Constipation, unspecified: Secondary | ICD-10-CM | POA: Diagnosis not present

## 2021-12-13 DIAGNOSIS — E559 Vitamin D deficiency, unspecified: Secondary | ICD-10-CM | POA: Diagnosis not present

## 2021-12-13 DIAGNOSIS — F03911 Unspecified dementia, unspecified severity, with agitation: Secondary | ICD-10-CM | POA: Diagnosis not present

## 2021-12-13 DIAGNOSIS — I10 Essential (primary) hypertension: Secondary | ICD-10-CM | POA: Diagnosis not present

## 2021-12-13 DIAGNOSIS — F0390 Unspecified dementia without behavioral disturbance: Secondary | ICD-10-CM | POA: Diagnosis not present

## 2021-12-13 DIAGNOSIS — E876 Hypokalemia: Secondary | ICD-10-CM | POA: Diagnosis not present

## 2021-12-13 DIAGNOSIS — R52 Pain, unspecified: Secondary | ICD-10-CM | POA: Diagnosis not present

## 2021-12-13 DIAGNOSIS — E785 Hyperlipidemia, unspecified: Secondary | ICD-10-CM | POA: Diagnosis not present

## 2021-12-14 DIAGNOSIS — Z736 Limitation of activities due to disability: Secondary | ICD-10-CM | POA: Diagnosis not present

## 2021-12-14 DIAGNOSIS — R296 Repeated falls: Secondary | ICD-10-CM | POA: Diagnosis not present

## 2021-12-14 DIAGNOSIS — R41841 Cognitive communication deficit: Secondary | ICD-10-CM | POA: Diagnosis not present

## 2021-12-14 DIAGNOSIS — M6259 Muscle wasting and atrophy, not elsewhere classified, multiple sites: Secondary | ICD-10-CM | POA: Diagnosis not present

## 2021-12-14 DIAGNOSIS — R52 Pain, unspecified: Secondary | ICD-10-CM | POA: Diagnosis not present

## 2021-12-14 DIAGNOSIS — Z96652 Presence of left artificial knee joint: Secondary | ICD-10-CM | POA: Diagnosis not present

## 2021-12-15 DIAGNOSIS — Z736 Limitation of activities due to disability: Secondary | ICD-10-CM | POA: Diagnosis not present

## 2021-12-15 DIAGNOSIS — M6259 Muscle wasting and atrophy, not elsewhere classified, multiple sites: Secondary | ICD-10-CM | POA: Diagnosis not present

## 2021-12-15 DIAGNOSIS — R296 Repeated falls: Secondary | ICD-10-CM | POA: Diagnosis not present

## 2021-12-15 DIAGNOSIS — R41841 Cognitive communication deficit: Secondary | ICD-10-CM | POA: Diagnosis not present

## 2021-12-15 DIAGNOSIS — R52 Pain, unspecified: Secondary | ICD-10-CM | POA: Diagnosis not present

## 2021-12-15 DIAGNOSIS — Z96652 Presence of left artificial knee joint: Secondary | ICD-10-CM | POA: Diagnosis not present

## 2021-12-15 DIAGNOSIS — F03B0 Unspecified dementia, moderate, without behavioral disturbance, psychotic disturbance, mood disturbance, and anxiety: Secondary | ICD-10-CM | POA: Diagnosis not present

## 2021-12-16 DIAGNOSIS — R52 Pain, unspecified: Secondary | ICD-10-CM | POA: Diagnosis not present

## 2021-12-16 DIAGNOSIS — Z736 Limitation of activities due to disability: Secondary | ICD-10-CM | POA: Diagnosis not present

## 2021-12-16 DIAGNOSIS — R296 Repeated falls: Secondary | ICD-10-CM | POA: Diagnosis not present

## 2021-12-16 DIAGNOSIS — R41841 Cognitive communication deficit: Secondary | ICD-10-CM | POA: Diagnosis not present

## 2021-12-16 DIAGNOSIS — M6259 Muscle wasting and atrophy, not elsewhere classified, multiple sites: Secondary | ICD-10-CM | POA: Diagnosis not present

## 2021-12-16 DIAGNOSIS — Z96652 Presence of left artificial knee joint: Secondary | ICD-10-CM | POA: Diagnosis not present

## 2021-12-19 DIAGNOSIS — Z96652 Presence of left artificial knee joint: Secondary | ICD-10-CM | POA: Diagnosis not present

## 2021-12-19 DIAGNOSIS — R41841 Cognitive communication deficit: Secondary | ICD-10-CM | POA: Diagnosis not present

## 2021-12-19 DIAGNOSIS — R296 Repeated falls: Secondary | ICD-10-CM | POA: Diagnosis not present

## 2021-12-19 DIAGNOSIS — R52 Pain, unspecified: Secondary | ICD-10-CM | POA: Diagnosis not present

## 2021-12-19 DIAGNOSIS — M6259 Muscle wasting and atrophy, not elsewhere classified, multiple sites: Secondary | ICD-10-CM | POA: Diagnosis not present

## 2021-12-19 DIAGNOSIS — Z736 Limitation of activities due to disability: Secondary | ICD-10-CM | POA: Diagnosis not present

## 2021-12-20 DIAGNOSIS — R296 Repeated falls: Secondary | ICD-10-CM | POA: Diagnosis not present

## 2021-12-20 DIAGNOSIS — Z736 Limitation of activities due to disability: Secondary | ICD-10-CM | POA: Diagnosis not present

## 2021-12-20 DIAGNOSIS — M6259 Muscle wasting and atrophy, not elsewhere classified, multiple sites: Secondary | ICD-10-CM | POA: Diagnosis not present

## 2021-12-20 DIAGNOSIS — Z96652 Presence of left artificial knee joint: Secondary | ICD-10-CM | POA: Diagnosis not present

## 2021-12-20 DIAGNOSIS — R41841 Cognitive communication deficit: Secondary | ICD-10-CM | POA: Diagnosis not present

## 2021-12-20 DIAGNOSIS — R52 Pain, unspecified: Secondary | ICD-10-CM | POA: Diagnosis not present

## 2021-12-21 DIAGNOSIS — R296 Repeated falls: Secondary | ICD-10-CM | POA: Diagnosis not present

## 2021-12-21 DIAGNOSIS — Z736 Limitation of activities due to disability: Secondary | ICD-10-CM | POA: Diagnosis not present

## 2021-12-21 DIAGNOSIS — R41841 Cognitive communication deficit: Secondary | ICD-10-CM | POA: Diagnosis not present

## 2021-12-21 DIAGNOSIS — R52 Pain, unspecified: Secondary | ICD-10-CM | POA: Diagnosis not present

## 2021-12-21 DIAGNOSIS — M6259 Muscle wasting and atrophy, not elsewhere classified, multiple sites: Secondary | ICD-10-CM | POA: Diagnosis not present

## 2021-12-21 DIAGNOSIS — Z96652 Presence of left artificial knee joint: Secondary | ICD-10-CM | POA: Diagnosis not present

## 2021-12-22 DIAGNOSIS — R52 Pain, unspecified: Secondary | ICD-10-CM | POA: Diagnosis not present

## 2021-12-22 DIAGNOSIS — Z96652 Presence of left artificial knee joint: Secondary | ICD-10-CM | POA: Diagnosis not present

## 2021-12-22 DIAGNOSIS — M6259 Muscle wasting and atrophy, not elsewhere classified, multiple sites: Secondary | ICD-10-CM | POA: Diagnosis not present

## 2021-12-22 DIAGNOSIS — R41841 Cognitive communication deficit: Secondary | ICD-10-CM | POA: Diagnosis not present

## 2021-12-22 DIAGNOSIS — G300 Alzheimer's disease with early onset: Secondary | ICD-10-CM | POA: Diagnosis not present

## 2021-12-22 DIAGNOSIS — F02B0 Dementia in other diseases classified elsewhere, moderate, without behavioral disturbance, psychotic disturbance, mood disturbance, and anxiety: Secondary | ICD-10-CM | POA: Diagnosis not present

## 2021-12-22 DIAGNOSIS — G4701 Insomnia due to medical condition: Secondary | ICD-10-CM | POA: Diagnosis not present

## 2021-12-22 DIAGNOSIS — R296 Repeated falls: Secondary | ICD-10-CM | POA: Diagnosis not present

## 2021-12-22 DIAGNOSIS — Z736 Limitation of activities due to disability: Secondary | ICD-10-CM | POA: Diagnosis not present

## 2021-12-23 DIAGNOSIS — Z736 Limitation of activities due to disability: Secondary | ICD-10-CM | POA: Diagnosis not present

## 2021-12-23 DIAGNOSIS — R296 Repeated falls: Secondary | ICD-10-CM | POA: Diagnosis not present

## 2021-12-23 DIAGNOSIS — Z96652 Presence of left artificial knee joint: Secondary | ICD-10-CM | POA: Diagnosis not present

## 2021-12-23 DIAGNOSIS — R41841 Cognitive communication deficit: Secondary | ICD-10-CM | POA: Diagnosis not present

## 2021-12-23 DIAGNOSIS — M6259 Muscle wasting and atrophy, not elsewhere classified, multiple sites: Secondary | ICD-10-CM | POA: Diagnosis not present

## 2021-12-23 DIAGNOSIS — R52 Pain, unspecified: Secondary | ICD-10-CM | POA: Diagnosis not present

## 2021-12-26 DIAGNOSIS — R52 Pain, unspecified: Secondary | ICD-10-CM | POA: Diagnosis not present

## 2021-12-26 DIAGNOSIS — Z736 Limitation of activities due to disability: Secondary | ICD-10-CM | POA: Diagnosis not present

## 2021-12-26 DIAGNOSIS — Z96652 Presence of left artificial knee joint: Secondary | ICD-10-CM | POA: Diagnosis not present

## 2021-12-26 DIAGNOSIS — M6259 Muscle wasting and atrophy, not elsewhere classified, multiple sites: Secondary | ICD-10-CM | POA: Diagnosis not present

## 2021-12-26 DIAGNOSIS — R296 Repeated falls: Secondary | ICD-10-CM | POA: Diagnosis not present

## 2021-12-26 DIAGNOSIS — R41841 Cognitive communication deficit: Secondary | ICD-10-CM | POA: Diagnosis not present

## 2021-12-27 DIAGNOSIS — M6259 Muscle wasting and atrophy, not elsewhere classified, multiple sites: Secondary | ICD-10-CM | POA: Diagnosis not present

## 2021-12-27 DIAGNOSIS — Z96652 Presence of left artificial knee joint: Secondary | ICD-10-CM | POA: Diagnosis not present

## 2021-12-27 DIAGNOSIS — R52 Pain, unspecified: Secondary | ICD-10-CM | POA: Diagnosis not present

## 2021-12-27 DIAGNOSIS — Z736 Limitation of activities due to disability: Secondary | ICD-10-CM | POA: Diagnosis not present

## 2021-12-27 DIAGNOSIS — R296 Repeated falls: Secondary | ICD-10-CM | POA: Diagnosis not present

## 2021-12-27 DIAGNOSIS — R41841 Cognitive communication deficit: Secondary | ICD-10-CM | POA: Diagnosis not present

## 2021-12-28 DIAGNOSIS — R41841 Cognitive communication deficit: Secondary | ICD-10-CM | POA: Diagnosis not present

## 2021-12-28 DIAGNOSIS — M6259 Muscle wasting and atrophy, not elsewhere classified, multiple sites: Secondary | ICD-10-CM | POA: Diagnosis not present

## 2021-12-28 DIAGNOSIS — Z96652 Presence of left artificial knee joint: Secondary | ICD-10-CM | POA: Diagnosis not present

## 2021-12-28 DIAGNOSIS — R52 Pain, unspecified: Secondary | ICD-10-CM | POA: Diagnosis not present

## 2021-12-28 DIAGNOSIS — Z736 Limitation of activities due to disability: Secondary | ICD-10-CM | POA: Diagnosis not present

## 2021-12-28 DIAGNOSIS — R296 Repeated falls: Secondary | ICD-10-CM | POA: Diagnosis not present

## 2021-12-29 DIAGNOSIS — R41841 Cognitive communication deficit: Secondary | ICD-10-CM | POA: Diagnosis not present

## 2021-12-29 DIAGNOSIS — R52 Pain, unspecified: Secondary | ICD-10-CM | POA: Diagnosis not present

## 2021-12-29 DIAGNOSIS — M6259 Muscle wasting and atrophy, not elsewhere classified, multiple sites: Secondary | ICD-10-CM | POA: Diagnosis not present

## 2021-12-29 DIAGNOSIS — Z96652 Presence of left artificial knee joint: Secondary | ICD-10-CM | POA: Diagnosis not present

## 2021-12-29 DIAGNOSIS — Z736 Limitation of activities due to disability: Secondary | ICD-10-CM | POA: Diagnosis not present

## 2021-12-29 DIAGNOSIS — R296 Repeated falls: Secondary | ICD-10-CM | POA: Diagnosis not present

## 2021-12-30 DIAGNOSIS — M6259 Muscle wasting and atrophy, not elsewhere classified, multiple sites: Secondary | ICD-10-CM | POA: Diagnosis not present

## 2021-12-30 DIAGNOSIS — Z736 Limitation of activities due to disability: Secondary | ICD-10-CM | POA: Diagnosis not present

## 2021-12-30 DIAGNOSIS — Z96652 Presence of left artificial knee joint: Secondary | ICD-10-CM | POA: Diagnosis not present

## 2021-12-30 DIAGNOSIS — R296 Repeated falls: Secondary | ICD-10-CM | POA: Diagnosis not present

## 2021-12-30 DIAGNOSIS — R52 Pain, unspecified: Secondary | ICD-10-CM | POA: Diagnosis not present

## 2021-12-30 DIAGNOSIS — R41841 Cognitive communication deficit: Secondary | ICD-10-CM | POA: Diagnosis not present

## 2022-01-02 DIAGNOSIS — R52 Pain, unspecified: Secondary | ICD-10-CM | POA: Diagnosis not present

## 2022-01-02 DIAGNOSIS — Z96652 Presence of left artificial knee joint: Secondary | ICD-10-CM | POA: Diagnosis not present

## 2022-01-02 DIAGNOSIS — R296 Repeated falls: Secondary | ICD-10-CM | POA: Diagnosis not present

## 2022-01-02 DIAGNOSIS — M6259 Muscle wasting and atrophy, not elsewhere classified, multiple sites: Secondary | ICD-10-CM | POA: Diagnosis not present

## 2022-01-02 DIAGNOSIS — Z736 Limitation of activities due to disability: Secondary | ICD-10-CM | POA: Diagnosis not present

## 2022-01-02 DIAGNOSIS — R41841 Cognitive communication deficit: Secondary | ICD-10-CM | POA: Diagnosis not present

## 2022-01-03 DIAGNOSIS — Z736 Limitation of activities due to disability: Secondary | ICD-10-CM | POA: Diagnosis not present

## 2022-01-03 DIAGNOSIS — R52 Pain, unspecified: Secondary | ICD-10-CM | POA: Diagnosis not present

## 2022-01-03 DIAGNOSIS — Z96652 Presence of left artificial knee joint: Secondary | ICD-10-CM | POA: Diagnosis not present

## 2022-01-03 DIAGNOSIS — M6259 Muscle wasting and atrophy, not elsewhere classified, multiple sites: Secondary | ICD-10-CM | POA: Diagnosis not present

## 2022-01-03 DIAGNOSIS — R41841 Cognitive communication deficit: Secondary | ICD-10-CM | POA: Diagnosis not present

## 2022-01-03 DIAGNOSIS — R296 Repeated falls: Secondary | ICD-10-CM | POA: Diagnosis not present

## 2022-01-04 DIAGNOSIS — M6259 Muscle wasting and atrophy, not elsewhere classified, multiple sites: Secondary | ICD-10-CM | POA: Diagnosis not present

## 2022-01-04 DIAGNOSIS — R296 Repeated falls: Secondary | ICD-10-CM | POA: Diagnosis not present

## 2022-01-04 DIAGNOSIS — Z736 Limitation of activities due to disability: Secondary | ICD-10-CM | POA: Diagnosis not present

## 2022-01-04 DIAGNOSIS — R52 Pain, unspecified: Secondary | ICD-10-CM | POA: Diagnosis not present

## 2022-01-04 DIAGNOSIS — R41841 Cognitive communication deficit: Secondary | ICD-10-CM | POA: Diagnosis not present

## 2022-01-04 DIAGNOSIS — Z96652 Presence of left artificial knee joint: Secondary | ICD-10-CM | POA: Diagnosis not present

## 2022-01-05 DIAGNOSIS — M6259 Muscle wasting and atrophy, not elsewhere classified, multiple sites: Secondary | ICD-10-CM | POA: Diagnosis not present

## 2022-01-05 DIAGNOSIS — R296 Repeated falls: Secondary | ICD-10-CM | POA: Diagnosis not present

## 2022-01-05 DIAGNOSIS — Z736 Limitation of activities due to disability: Secondary | ICD-10-CM | POA: Diagnosis not present

## 2022-01-05 DIAGNOSIS — Z96652 Presence of left artificial knee joint: Secondary | ICD-10-CM | POA: Diagnosis not present

## 2022-01-05 DIAGNOSIS — I2510 Atherosclerotic heart disease of native coronary artery without angina pectoris: Secondary | ICD-10-CM | POA: Diagnosis not present

## 2022-01-05 DIAGNOSIS — R41841 Cognitive communication deficit: Secondary | ICD-10-CM | POA: Diagnosis not present

## 2022-01-05 DIAGNOSIS — F03B0 Unspecified dementia, moderate, without behavioral disturbance, psychotic disturbance, mood disturbance, and anxiety: Secondary | ICD-10-CM | POA: Diagnosis not present

## 2022-01-05 DIAGNOSIS — E559 Vitamin D deficiency, unspecified: Secondary | ICD-10-CM | POA: Diagnosis not present

## 2022-01-05 DIAGNOSIS — R52 Pain, unspecified: Secondary | ICD-10-CM | POA: Diagnosis not present

## 2022-01-05 DIAGNOSIS — G309 Alzheimer's disease, unspecified: Secondary | ICD-10-CM | POA: Diagnosis not present

## 2022-01-05 DIAGNOSIS — I10 Essential (primary) hypertension: Secondary | ICD-10-CM | POA: Diagnosis not present

## 2022-01-06 DIAGNOSIS — R52 Pain, unspecified: Secondary | ICD-10-CM | POA: Diagnosis not present

## 2022-01-06 DIAGNOSIS — Z736 Limitation of activities due to disability: Secondary | ICD-10-CM | POA: Diagnosis not present

## 2022-01-06 DIAGNOSIS — M6259 Muscle wasting and atrophy, not elsewhere classified, multiple sites: Secondary | ICD-10-CM | POA: Diagnosis not present

## 2022-01-06 DIAGNOSIS — R41841 Cognitive communication deficit: Secondary | ICD-10-CM | POA: Diagnosis not present

## 2022-01-06 DIAGNOSIS — R296 Repeated falls: Secondary | ICD-10-CM | POA: Diagnosis not present

## 2022-01-06 DIAGNOSIS — Z96652 Presence of left artificial knee joint: Secondary | ICD-10-CM | POA: Diagnosis not present

## 2022-01-08 DIAGNOSIS — R52 Pain, unspecified: Secondary | ICD-10-CM | POA: Diagnosis not present

## 2022-01-08 DIAGNOSIS — Z736 Limitation of activities due to disability: Secondary | ICD-10-CM | POA: Diagnosis not present

## 2022-01-08 DIAGNOSIS — Z96652 Presence of left artificial knee joint: Secondary | ICD-10-CM | POA: Diagnosis not present

## 2022-01-08 DIAGNOSIS — R41841 Cognitive communication deficit: Secondary | ICD-10-CM | POA: Diagnosis not present

## 2022-01-08 DIAGNOSIS — M6259 Muscle wasting and atrophy, not elsewhere classified, multiple sites: Secondary | ICD-10-CM | POA: Diagnosis not present

## 2022-01-08 DIAGNOSIS — R296 Repeated falls: Secondary | ICD-10-CM | POA: Diagnosis not present

## 2022-01-09 DIAGNOSIS — E559 Vitamin D deficiency, unspecified: Secondary | ICD-10-CM | POA: Diagnosis not present

## 2022-01-09 DIAGNOSIS — E119 Type 2 diabetes mellitus without complications: Secondary | ICD-10-CM | POA: Diagnosis not present

## 2022-01-10 DIAGNOSIS — R41841 Cognitive communication deficit: Secondary | ICD-10-CM | POA: Diagnosis not present

## 2022-01-10 DIAGNOSIS — R296 Repeated falls: Secondary | ICD-10-CM | POA: Diagnosis not present

## 2022-01-10 DIAGNOSIS — Z96652 Presence of left artificial knee joint: Secondary | ICD-10-CM | POA: Diagnosis not present

## 2022-01-10 DIAGNOSIS — M6259 Muscle wasting and atrophy, not elsewhere classified, multiple sites: Secondary | ICD-10-CM | POA: Diagnosis not present

## 2022-01-10 DIAGNOSIS — R52 Pain, unspecified: Secondary | ICD-10-CM | POA: Diagnosis not present

## 2022-01-10 DIAGNOSIS — Z736 Limitation of activities due to disability: Secondary | ICD-10-CM | POA: Diagnosis not present

## 2022-01-11 DIAGNOSIS — Z736 Limitation of activities due to disability: Secondary | ICD-10-CM | POA: Diagnosis not present

## 2022-01-11 DIAGNOSIS — R296 Repeated falls: Secondary | ICD-10-CM | POA: Diagnosis not present

## 2022-01-11 DIAGNOSIS — R41841 Cognitive communication deficit: Secondary | ICD-10-CM | POA: Diagnosis not present

## 2022-01-11 DIAGNOSIS — M6259 Muscle wasting and atrophy, not elsewhere classified, multiple sites: Secondary | ICD-10-CM | POA: Diagnosis not present

## 2022-01-11 DIAGNOSIS — Z96652 Presence of left artificial knee joint: Secondary | ICD-10-CM | POA: Diagnosis not present

## 2022-01-11 DIAGNOSIS — R52 Pain, unspecified: Secondary | ICD-10-CM | POA: Diagnosis not present

## 2022-01-12 DIAGNOSIS — R4182 Altered mental status, unspecified: Secondary | ICD-10-CM | POA: Diagnosis not present

## 2022-01-12 DIAGNOSIS — Z736 Limitation of activities due to disability: Secondary | ICD-10-CM | POA: Diagnosis not present

## 2022-01-12 DIAGNOSIS — G309 Alzheimer's disease, unspecified: Secondary | ICD-10-CM | POA: Diagnosis not present

## 2022-01-12 DIAGNOSIS — Z96652 Presence of left artificial knee joint: Secondary | ICD-10-CM | POA: Diagnosis not present

## 2022-01-12 DIAGNOSIS — R52 Pain, unspecified: Secondary | ICD-10-CM | POA: Diagnosis not present

## 2022-01-12 DIAGNOSIS — R41841 Cognitive communication deficit: Secondary | ICD-10-CM | POA: Diagnosis not present

## 2022-01-12 DIAGNOSIS — R296 Repeated falls: Secondary | ICD-10-CM | POA: Diagnosis not present

## 2022-01-12 DIAGNOSIS — M6259 Muscle wasting and atrophy, not elsewhere classified, multiple sites: Secondary | ICD-10-CM | POA: Diagnosis not present

## 2022-01-13 DIAGNOSIS — I2510 Atherosclerotic heart disease of native coronary artery without angina pectoris: Secondary | ICD-10-CM | POA: Diagnosis not present

## 2022-01-13 DIAGNOSIS — Z96652 Presence of left artificial knee joint: Secondary | ICD-10-CM | POA: Diagnosis not present

## 2022-01-13 DIAGNOSIS — E559 Vitamin D deficiency, unspecified: Secondary | ICD-10-CM | POA: Diagnosis not present

## 2022-01-13 DIAGNOSIS — M810 Age-related osteoporosis without current pathological fracture: Secondary | ICD-10-CM | POA: Diagnosis not present

## 2022-01-13 DIAGNOSIS — E876 Hypokalemia: Secondary | ICD-10-CM | POA: Diagnosis not present

## 2022-01-13 DIAGNOSIS — F0390 Unspecified dementia without behavioral disturbance: Secondary | ICD-10-CM | POA: Diagnosis not present

## 2022-01-13 DIAGNOSIS — R41841 Cognitive communication deficit: Secondary | ICD-10-CM | POA: Diagnosis not present

## 2022-01-13 DIAGNOSIS — I10 Essential (primary) hypertension: Secondary | ICD-10-CM | POA: Diagnosis not present

## 2022-01-13 DIAGNOSIS — F03911 Unspecified dementia, unspecified severity, with agitation: Secondary | ICD-10-CM | POA: Diagnosis not present

## 2022-01-13 DIAGNOSIS — Z736 Limitation of activities due to disability: Secondary | ICD-10-CM | POA: Diagnosis not present

## 2022-01-13 DIAGNOSIS — M6259 Muscle wasting and atrophy, not elsewhere classified, multiple sites: Secondary | ICD-10-CM | POA: Diagnosis not present

## 2022-01-13 DIAGNOSIS — N390 Urinary tract infection, site not specified: Secondary | ICD-10-CM | POA: Diagnosis not present

## 2022-01-13 DIAGNOSIS — E785 Hyperlipidemia, unspecified: Secondary | ICD-10-CM | POA: Diagnosis not present

## 2022-01-13 DIAGNOSIS — R296 Repeated falls: Secondary | ICD-10-CM | POA: Diagnosis not present

## 2022-01-13 DIAGNOSIS — D519 Vitamin B12 deficiency anemia, unspecified: Secondary | ICD-10-CM | POA: Diagnosis not present

## 2022-01-13 DIAGNOSIS — K5900 Constipation, unspecified: Secondary | ICD-10-CM | POA: Diagnosis not present

## 2022-01-13 DIAGNOSIS — R278 Other lack of coordination: Secondary | ICD-10-CM | POA: Diagnosis not present

## 2022-01-13 DIAGNOSIS — R52 Pain, unspecified: Secondary | ICD-10-CM | POA: Diagnosis not present

## 2022-01-16 DIAGNOSIS — R41841 Cognitive communication deficit: Secondary | ICD-10-CM | POA: Diagnosis not present

## 2022-01-16 DIAGNOSIS — R52 Pain, unspecified: Secondary | ICD-10-CM | POA: Diagnosis not present

## 2022-01-16 DIAGNOSIS — Z96652 Presence of left artificial knee joint: Secondary | ICD-10-CM | POA: Diagnosis not present

## 2022-01-16 DIAGNOSIS — Z736 Limitation of activities due to disability: Secondary | ICD-10-CM | POA: Diagnosis not present

## 2022-01-16 DIAGNOSIS — R296 Repeated falls: Secondary | ICD-10-CM | POA: Diagnosis not present

## 2022-01-16 DIAGNOSIS — M6259 Muscle wasting and atrophy, not elsewhere classified, multiple sites: Secondary | ICD-10-CM | POA: Diagnosis not present

## 2022-01-17 DIAGNOSIS — Z736 Limitation of activities due to disability: Secondary | ICD-10-CM | POA: Diagnosis not present

## 2022-01-17 DIAGNOSIS — R296 Repeated falls: Secondary | ICD-10-CM | POA: Diagnosis not present

## 2022-01-17 DIAGNOSIS — R41841 Cognitive communication deficit: Secondary | ICD-10-CM | POA: Diagnosis not present

## 2022-01-17 DIAGNOSIS — R52 Pain, unspecified: Secondary | ICD-10-CM | POA: Diagnosis not present

## 2022-01-17 DIAGNOSIS — M6259 Muscle wasting and atrophy, not elsewhere classified, multiple sites: Secondary | ICD-10-CM | POA: Diagnosis not present

## 2022-01-17 DIAGNOSIS — Z96652 Presence of left artificial knee joint: Secondary | ICD-10-CM | POA: Diagnosis not present

## 2022-01-18 DIAGNOSIS — M6259 Muscle wasting and atrophy, not elsewhere classified, multiple sites: Secondary | ICD-10-CM | POA: Diagnosis not present

## 2022-01-18 DIAGNOSIS — R296 Repeated falls: Secondary | ICD-10-CM | POA: Diagnosis not present

## 2022-01-18 DIAGNOSIS — Z96652 Presence of left artificial knee joint: Secondary | ICD-10-CM | POA: Diagnosis not present

## 2022-01-18 DIAGNOSIS — Z736 Limitation of activities due to disability: Secondary | ICD-10-CM | POA: Diagnosis not present

## 2022-01-18 DIAGNOSIS — R41841 Cognitive communication deficit: Secondary | ICD-10-CM | POA: Diagnosis not present

## 2022-01-18 DIAGNOSIS — R52 Pain, unspecified: Secondary | ICD-10-CM | POA: Diagnosis not present

## 2022-01-19 DIAGNOSIS — Z96652 Presence of left artificial knee joint: Secondary | ICD-10-CM | POA: Diagnosis not present

## 2022-01-19 DIAGNOSIS — Z736 Limitation of activities due to disability: Secondary | ICD-10-CM | POA: Diagnosis not present

## 2022-01-19 DIAGNOSIS — R296 Repeated falls: Secondary | ICD-10-CM | POA: Diagnosis not present

## 2022-01-19 DIAGNOSIS — M6259 Muscle wasting and atrophy, not elsewhere classified, multiple sites: Secondary | ICD-10-CM | POA: Diagnosis not present

## 2022-01-19 DIAGNOSIS — R41841 Cognitive communication deficit: Secondary | ICD-10-CM | POA: Diagnosis not present

## 2022-01-19 DIAGNOSIS — R52 Pain, unspecified: Secondary | ICD-10-CM | POA: Diagnosis not present

## 2022-01-20 DIAGNOSIS — M6259 Muscle wasting and atrophy, not elsewhere classified, multiple sites: Secondary | ICD-10-CM | POA: Diagnosis not present

## 2022-01-20 DIAGNOSIS — Z96652 Presence of left artificial knee joint: Secondary | ICD-10-CM | POA: Diagnosis not present

## 2022-01-20 DIAGNOSIS — R52 Pain, unspecified: Secondary | ICD-10-CM | POA: Diagnosis not present

## 2022-01-20 DIAGNOSIS — R41841 Cognitive communication deficit: Secondary | ICD-10-CM | POA: Diagnosis not present

## 2022-01-20 DIAGNOSIS — R296 Repeated falls: Secondary | ICD-10-CM | POA: Diagnosis not present

## 2022-01-20 DIAGNOSIS — Z736 Limitation of activities due to disability: Secondary | ICD-10-CM | POA: Diagnosis not present

## 2022-01-21 DIAGNOSIS — R52 Pain, unspecified: Secondary | ICD-10-CM | POA: Diagnosis not present

## 2022-01-21 DIAGNOSIS — R296 Repeated falls: Secondary | ICD-10-CM | POA: Diagnosis not present

## 2022-01-21 DIAGNOSIS — R41841 Cognitive communication deficit: Secondary | ICD-10-CM | POA: Diagnosis not present

## 2022-01-21 DIAGNOSIS — Z96652 Presence of left artificial knee joint: Secondary | ICD-10-CM | POA: Diagnosis not present

## 2022-01-21 DIAGNOSIS — Z736 Limitation of activities due to disability: Secondary | ICD-10-CM | POA: Diagnosis not present

## 2022-01-21 DIAGNOSIS — M6259 Muscle wasting and atrophy, not elsewhere classified, multiple sites: Secondary | ICD-10-CM | POA: Diagnosis not present

## 2022-01-24 DIAGNOSIS — Z736 Limitation of activities due to disability: Secondary | ICD-10-CM | POA: Diagnosis not present

## 2022-01-24 DIAGNOSIS — R296 Repeated falls: Secondary | ICD-10-CM | POA: Diagnosis not present

## 2022-01-24 DIAGNOSIS — R52 Pain, unspecified: Secondary | ICD-10-CM | POA: Diagnosis not present

## 2022-01-24 DIAGNOSIS — Z96652 Presence of left artificial knee joint: Secondary | ICD-10-CM | POA: Diagnosis not present

## 2022-01-24 DIAGNOSIS — R41841 Cognitive communication deficit: Secondary | ICD-10-CM | POA: Diagnosis not present

## 2022-01-24 DIAGNOSIS — M6259 Muscle wasting and atrophy, not elsewhere classified, multiple sites: Secondary | ICD-10-CM | POA: Diagnosis not present

## 2022-01-25 DIAGNOSIS — Z96652 Presence of left artificial knee joint: Secondary | ICD-10-CM | POA: Diagnosis not present

## 2022-01-25 DIAGNOSIS — R41841 Cognitive communication deficit: Secondary | ICD-10-CM | POA: Diagnosis not present

## 2022-01-25 DIAGNOSIS — R52 Pain, unspecified: Secondary | ICD-10-CM | POA: Diagnosis not present

## 2022-01-25 DIAGNOSIS — M6259 Muscle wasting and atrophy, not elsewhere classified, multiple sites: Secondary | ICD-10-CM | POA: Diagnosis not present

## 2022-01-25 DIAGNOSIS — Z736 Limitation of activities due to disability: Secondary | ICD-10-CM | POA: Diagnosis not present

## 2022-01-25 DIAGNOSIS — R296 Repeated falls: Secondary | ICD-10-CM | POA: Diagnosis not present

## 2022-01-26 DIAGNOSIS — R296 Repeated falls: Secondary | ICD-10-CM | POA: Diagnosis not present

## 2022-01-26 DIAGNOSIS — Z736 Limitation of activities due to disability: Secondary | ICD-10-CM | POA: Diagnosis not present

## 2022-01-26 DIAGNOSIS — R52 Pain, unspecified: Secondary | ICD-10-CM | POA: Diagnosis not present

## 2022-01-26 DIAGNOSIS — M6259 Muscle wasting and atrophy, not elsewhere classified, multiple sites: Secondary | ICD-10-CM | POA: Diagnosis not present

## 2022-01-26 DIAGNOSIS — Z96652 Presence of left artificial knee joint: Secondary | ICD-10-CM | POA: Diagnosis not present

## 2022-01-26 DIAGNOSIS — R41841 Cognitive communication deficit: Secondary | ICD-10-CM | POA: Diagnosis not present

## 2022-01-27 DIAGNOSIS — R41841 Cognitive communication deficit: Secondary | ICD-10-CM | POA: Diagnosis not present

## 2022-01-27 DIAGNOSIS — R296 Repeated falls: Secondary | ICD-10-CM | POA: Diagnosis not present

## 2022-01-27 DIAGNOSIS — Z96652 Presence of left artificial knee joint: Secondary | ICD-10-CM | POA: Diagnosis not present

## 2022-01-27 DIAGNOSIS — M6259 Muscle wasting and atrophy, not elsewhere classified, multiple sites: Secondary | ICD-10-CM | POA: Diagnosis not present

## 2022-01-27 DIAGNOSIS — Z736 Limitation of activities due to disability: Secondary | ICD-10-CM | POA: Diagnosis not present

## 2022-01-27 DIAGNOSIS — R52 Pain, unspecified: Secondary | ICD-10-CM | POA: Diagnosis not present

## 2022-01-28 DIAGNOSIS — R52 Pain, unspecified: Secondary | ICD-10-CM | POA: Diagnosis not present

## 2022-01-28 DIAGNOSIS — Z736 Limitation of activities due to disability: Secondary | ICD-10-CM | POA: Diagnosis not present

## 2022-01-28 DIAGNOSIS — M6259 Muscle wasting and atrophy, not elsewhere classified, multiple sites: Secondary | ICD-10-CM | POA: Diagnosis not present

## 2022-01-28 DIAGNOSIS — Z96652 Presence of left artificial knee joint: Secondary | ICD-10-CM | POA: Diagnosis not present

## 2022-01-28 DIAGNOSIS — R41841 Cognitive communication deficit: Secondary | ICD-10-CM | POA: Diagnosis not present

## 2022-01-28 DIAGNOSIS — R296 Repeated falls: Secondary | ICD-10-CM | POA: Diagnosis not present

## 2022-01-30 DIAGNOSIS — M6259 Muscle wasting and atrophy, not elsewhere classified, multiple sites: Secondary | ICD-10-CM | POA: Diagnosis not present

## 2022-01-30 DIAGNOSIS — Z96652 Presence of left artificial knee joint: Secondary | ICD-10-CM | POA: Diagnosis not present

## 2022-01-30 DIAGNOSIS — R41841 Cognitive communication deficit: Secondary | ICD-10-CM | POA: Diagnosis not present

## 2022-01-30 DIAGNOSIS — Z736 Limitation of activities due to disability: Secondary | ICD-10-CM | POA: Diagnosis not present

## 2022-01-30 DIAGNOSIS — R296 Repeated falls: Secondary | ICD-10-CM | POA: Diagnosis not present

## 2022-01-30 DIAGNOSIS — R52 Pain, unspecified: Secondary | ICD-10-CM | POA: Diagnosis not present

## 2022-01-31 DIAGNOSIS — M6259 Muscle wasting and atrophy, not elsewhere classified, multiple sites: Secondary | ICD-10-CM | POA: Diagnosis not present

## 2022-01-31 DIAGNOSIS — R296 Repeated falls: Secondary | ICD-10-CM | POA: Diagnosis not present

## 2022-01-31 DIAGNOSIS — Z96652 Presence of left artificial knee joint: Secondary | ICD-10-CM | POA: Diagnosis not present

## 2022-01-31 DIAGNOSIS — R52 Pain, unspecified: Secondary | ICD-10-CM | POA: Diagnosis not present

## 2022-01-31 DIAGNOSIS — Z736 Limitation of activities due to disability: Secondary | ICD-10-CM | POA: Diagnosis not present

## 2022-01-31 DIAGNOSIS — R41841 Cognitive communication deficit: Secondary | ICD-10-CM | POA: Diagnosis not present

## 2022-02-01 DIAGNOSIS — M6259 Muscle wasting and atrophy, not elsewhere classified, multiple sites: Secondary | ICD-10-CM | POA: Diagnosis not present

## 2022-02-01 DIAGNOSIS — R296 Repeated falls: Secondary | ICD-10-CM | POA: Diagnosis not present

## 2022-02-01 DIAGNOSIS — R41841 Cognitive communication deficit: Secondary | ICD-10-CM | POA: Diagnosis not present

## 2022-02-01 DIAGNOSIS — Z736 Limitation of activities due to disability: Secondary | ICD-10-CM | POA: Diagnosis not present

## 2022-02-01 DIAGNOSIS — Z96652 Presence of left artificial knee joint: Secondary | ICD-10-CM | POA: Diagnosis not present

## 2022-02-01 DIAGNOSIS — R52 Pain, unspecified: Secondary | ICD-10-CM | POA: Diagnosis not present

## 2022-02-03 DIAGNOSIS — R52 Pain, unspecified: Secondary | ICD-10-CM | POA: Diagnosis not present

## 2022-02-03 DIAGNOSIS — R41841 Cognitive communication deficit: Secondary | ICD-10-CM | POA: Diagnosis not present

## 2022-02-03 DIAGNOSIS — Z736 Limitation of activities due to disability: Secondary | ICD-10-CM | POA: Diagnosis not present

## 2022-02-03 DIAGNOSIS — M6259 Muscle wasting and atrophy, not elsewhere classified, multiple sites: Secondary | ICD-10-CM | POA: Diagnosis not present

## 2022-02-03 DIAGNOSIS — R296 Repeated falls: Secondary | ICD-10-CM | POA: Diagnosis not present

## 2022-02-03 DIAGNOSIS — Z96652 Presence of left artificial knee joint: Secondary | ICD-10-CM | POA: Diagnosis not present

## 2022-02-05 DIAGNOSIS — Z736 Limitation of activities due to disability: Secondary | ICD-10-CM | POA: Diagnosis not present

## 2022-02-05 DIAGNOSIS — M6259 Muscle wasting and atrophy, not elsewhere classified, multiple sites: Secondary | ICD-10-CM | POA: Diagnosis not present

## 2022-02-05 DIAGNOSIS — R52 Pain, unspecified: Secondary | ICD-10-CM | POA: Diagnosis not present

## 2022-02-05 DIAGNOSIS — Z96652 Presence of left artificial knee joint: Secondary | ICD-10-CM | POA: Diagnosis not present

## 2022-02-05 DIAGNOSIS — R296 Repeated falls: Secondary | ICD-10-CM | POA: Diagnosis not present

## 2022-02-05 DIAGNOSIS — R41841 Cognitive communication deficit: Secondary | ICD-10-CM | POA: Diagnosis not present

## 2022-02-06 DIAGNOSIS — R296 Repeated falls: Secondary | ICD-10-CM | POA: Diagnosis not present

## 2022-02-06 DIAGNOSIS — Z96652 Presence of left artificial knee joint: Secondary | ICD-10-CM | POA: Diagnosis not present

## 2022-02-06 DIAGNOSIS — R52 Pain, unspecified: Secondary | ICD-10-CM | POA: Diagnosis not present

## 2022-02-06 DIAGNOSIS — Z736 Limitation of activities due to disability: Secondary | ICD-10-CM | POA: Diagnosis not present

## 2022-02-06 DIAGNOSIS — G300 Alzheimer's disease with early onset: Secondary | ICD-10-CM | POA: Diagnosis not present

## 2022-02-06 DIAGNOSIS — M6259 Muscle wasting and atrophy, not elsewhere classified, multiple sites: Secondary | ICD-10-CM | POA: Diagnosis not present

## 2022-02-06 DIAGNOSIS — F02B0 Dementia in other diseases classified elsewhere, moderate, without behavioral disturbance, psychotic disturbance, mood disturbance, and anxiety: Secondary | ICD-10-CM | POA: Diagnosis not present

## 2022-02-06 DIAGNOSIS — G4701 Insomnia due to medical condition: Secondary | ICD-10-CM | POA: Diagnosis not present

## 2022-02-06 DIAGNOSIS — R41841 Cognitive communication deficit: Secondary | ICD-10-CM | POA: Diagnosis not present

## 2022-02-18 DIAGNOSIS — N390 Urinary tract infection, site not specified: Secondary | ICD-10-CM | POA: Diagnosis not present

## 2022-02-20 DIAGNOSIS — N390 Urinary tract infection, site not specified: Secondary | ICD-10-CM | POA: Diagnosis not present

## 2022-02-23 DIAGNOSIS — I10 Essential (primary) hypertension: Secondary | ICD-10-CM | POA: Diagnosis not present

## 2022-03-07 DIAGNOSIS — R52 Pain, unspecified: Secondary | ICD-10-CM | POA: Diagnosis not present

## 2022-03-07 DIAGNOSIS — G309 Alzheimer's disease, unspecified: Secondary | ICD-10-CM | POA: Diagnosis not present

## 2022-03-07 DIAGNOSIS — R296 Repeated falls: Secondary | ICD-10-CM | POA: Diagnosis not present

## 2022-03-07 DIAGNOSIS — F0390 Unspecified dementia without behavioral disturbance: Secondary | ICD-10-CM | POA: Diagnosis not present

## 2022-03-07 DIAGNOSIS — Z79899 Other long term (current) drug therapy: Secondary | ICD-10-CM | POA: Diagnosis not present

## 2022-03-07 DIAGNOSIS — E785 Hyperlipidemia, unspecified: Secondary | ICD-10-CM | POA: Diagnosis not present

## 2022-03-07 DIAGNOSIS — B3731 Acute candidiasis of vulva and vagina: Secondary | ICD-10-CM | POA: Diagnosis not present

## 2022-03-07 DIAGNOSIS — K5900 Constipation, unspecified: Secondary | ICD-10-CM | POA: Diagnosis not present

## 2022-03-07 DIAGNOSIS — I2510 Atherosclerotic heart disease of native coronary artery without angina pectoris: Secondary | ICD-10-CM | POA: Diagnosis not present

## 2022-03-07 DIAGNOSIS — R350 Frequency of micturition: Secondary | ICD-10-CM | POA: Diagnosis not present

## 2022-03-07 DIAGNOSIS — I10 Essential (primary) hypertension: Secondary | ICD-10-CM | POA: Diagnosis not present

## 2022-03-07 DIAGNOSIS — F03911 Unspecified dementia, unspecified severity, with agitation: Secondary | ICD-10-CM | POA: Diagnosis not present

## 2022-03-07 DIAGNOSIS — E876 Hypokalemia: Secondary | ICD-10-CM | POA: Diagnosis not present

## 2022-03-07 DIAGNOSIS — Z96652 Presence of left artificial knee joint: Secondary | ICD-10-CM | POA: Diagnosis not present

## 2022-03-07 DIAGNOSIS — R278 Other lack of coordination: Secondary | ICD-10-CM | POA: Diagnosis not present

## 2022-03-07 DIAGNOSIS — R1312 Dysphagia, oropharyngeal phase: Secondary | ICD-10-CM | POA: Diagnosis not present

## 2022-03-07 DIAGNOSIS — M810 Age-related osteoporosis without current pathological fracture: Secondary | ICD-10-CM | POA: Diagnosis not present

## 2022-03-07 DIAGNOSIS — E559 Vitamin D deficiency, unspecified: Secondary | ICD-10-CM | POA: Diagnosis not present

## 2022-03-07 DIAGNOSIS — N952 Postmenopausal atrophic vaginitis: Secondary | ICD-10-CM | POA: Diagnosis not present

## 2022-03-07 DIAGNOSIS — Z736 Limitation of activities due to disability: Secondary | ICD-10-CM | POA: Diagnosis not present

## 2022-03-07 DIAGNOSIS — M6259 Muscle wasting and atrophy, not elsewhere classified, multiple sites: Secondary | ICD-10-CM | POA: Diagnosis not present

## 2022-03-07 DIAGNOSIS — R41841 Cognitive communication deficit: Secondary | ICD-10-CM | POA: Diagnosis not present

## 2022-03-07 DIAGNOSIS — N390 Urinary tract infection, site not specified: Secondary | ICD-10-CM | POA: Diagnosis not present

## 2022-03-07 DIAGNOSIS — D519 Vitamin B12 deficiency anemia, unspecified: Secondary | ICD-10-CM | POA: Diagnosis not present

## 2022-03-08 DIAGNOSIS — E119 Type 2 diabetes mellitus without complications: Secondary | ICD-10-CM | POA: Diagnosis not present

## 2022-03-08 DIAGNOSIS — E559 Vitamin D deficiency, unspecified: Secondary | ICD-10-CM | POA: Diagnosis not present

## 2022-03-08 DIAGNOSIS — M6259 Muscle wasting and atrophy, not elsewhere classified, multiple sites: Secondary | ICD-10-CM | POA: Diagnosis not present

## 2022-03-08 DIAGNOSIS — R41841 Cognitive communication deficit: Secondary | ICD-10-CM | POA: Diagnosis not present

## 2022-03-08 DIAGNOSIS — E785 Hyperlipidemia, unspecified: Secondary | ICD-10-CM | POA: Diagnosis not present

## 2022-03-08 DIAGNOSIS — Z96652 Presence of left artificial knee joint: Secondary | ICD-10-CM | POA: Diagnosis not present

## 2022-03-08 DIAGNOSIS — R52 Pain, unspecified: Secondary | ICD-10-CM | POA: Diagnosis not present

## 2022-03-08 DIAGNOSIS — R1312 Dysphagia, oropharyngeal phase: Secondary | ICD-10-CM | POA: Diagnosis not present

## 2022-03-09 DIAGNOSIS — F02B0 Dementia in other diseases classified elsewhere, moderate, without behavioral disturbance, psychotic disturbance, mood disturbance, and anxiety: Secondary | ICD-10-CM | POA: Diagnosis not present

## 2022-03-09 DIAGNOSIS — R1312 Dysphagia, oropharyngeal phase: Secondary | ICD-10-CM | POA: Diagnosis not present

## 2022-03-09 DIAGNOSIS — R52 Pain, unspecified: Secondary | ICD-10-CM | POA: Diagnosis not present

## 2022-03-09 DIAGNOSIS — M6259 Muscle wasting and atrophy, not elsewhere classified, multiple sites: Secondary | ICD-10-CM | POA: Diagnosis not present

## 2022-03-09 DIAGNOSIS — E785 Hyperlipidemia, unspecified: Secondary | ICD-10-CM | POA: Diagnosis not present

## 2022-03-09 DIAGNOSIS — G4701 Insomnia due to medical condition: Secondary | ICD-10-CM | POA: Diagnosis not present

## 2022-03-09 DIAGNOSIS — R41841 Cognitive communication deficit: Secondary | ICD-10-CM | POA: Diagnosis not present

## 2022-03-09 DIAGNOSIS — Z96652 Presence of left artificial knee joint: Secondary | ICD-10-CM | POA: Diagnosis not present

## 2022-03-09 DIAGNOSIS — G300 Alzheimer's disease with early onset: Secondary | ICD-10-CM | POA: Diagnosis not present

## 2022-03-10 DIAGNOSIS — R1312 Dysphagia, oropharyngeal phase: Secondary | ICD-10-CM | POA: Diagnosis not present

## 2022-03-10 DIAGNOSIS — E785 Hyperlipidemia, unspecified: Secondary | ICD-10-CM | POA: Diagnosis not present

## 2022-03-10 DIAGNOSIS — Z96652 Presence of left artificial knee joint: Secondary | ICD-10-CM | POA: Diagnosis not present

## 2022-03-10 DIAGNOSIS — R41841 Cognitive communication deficit: Secondary | ICD-10-CM | POA: Diagnosis not present

## 2022-03-10 DIAGNOSIS — M6259 Muscle wasting and atrophy, not elsewhere classified, multiple sites: Secondary | ICD-10-CM | POA: Diagnosis not present

## 2022-03-10 DIAGNOSIS — R52 Pain, unspecified: Secondary | ICD-10-CM | POA: Diagnosis not present

## 2022-03-13 DIAGNOSIS — R41841 Cognitive communication deficit: Secondary | ICD-10-CM | POA: Diagnosis not present

## 2022-03-13 DIAGNOSIS — Z96652 Presence of left artificial knee joint: Secondary | ICD-10-CM | POA: Diagnosis not present

## 2022-03-13 DIAGNOSIS — R1312 Dysphagia, oropharyngeal phase: Secondary | ICD-10-CM | POA: Diagnosis not present

## 2022-03-13 DIAGNOSIS — R52 Pain, unspecified: Secondary | ICD-10-CM | POA: Diagnosis not present

## 2022-03-13 DIAGNOSIS — E785 Hyperlipidemia, unspecified: Secondary | ICD-10-CM | POA: Diagnosis not present

## 2022-03-13 DIAGNOSIS — M6259 Muscle wasting and atrophy, not elsewhere classified, multiple sites: Secondary | ICD-10-CM | POA: Diagnosis not present

## 2022-03-14 DIAGNOSIS — R1312 Dysphagia, oropharyngeal phase: Secondary | ICD-10-CM | POA: Diagnosis not present

## 2022-03-14 DIAGNOSIS — R41841 Cognitive communication deficit: Secondary | ICD-10-CM | POA: Diagnosis not present

## 2022-03-14 DIAGNOSIS — E785 Hyperlipidemia, unspecified: Secondary | ICD-10-CM | POA: Diagnosis not present

## 2022-03-14 DIAGNOSIS — R52 Pain, unspecified: Secondary | ICD-10-CM | POA: Diagnosis not present

## 2022-03-14 DIAGNOSIS — Z96652 Presence of left artificial knee joint: Secondary | ICD-10-CM | POA: Diagnosis not present

## 2022-03-14 DIAGNOSIS — M6259 Muscle wasting and atrophy, not elsewhere classified, multiple sites: Secondary | ICD-10-CM | POA: Diagnosis not present

## 2022-03-15 DIAGNOSIS — K5900 Constipation, unspecified: Secondary | ICD-10-CM | POA: Diagnosis not present

## 2022-03-15 DIAGNOSIS — F0390 Unspecified dementia without behavioral disturbance: Secondary | ICD-10-CM | POA: Diagnosis not present

## 2022-03-15 DIAGNOSIS — M6259 Muscle wasting and atrophy, not elsewhere classified, multiple sites: Secondary | ICD-10-CM | POA: Diagnosis not present

## 2022-03-15 DIAGNOSIS — Z736 Limitation of activities due to disability: Secondary | ICD-10-CM | POA: Diagnosis not present

## 2022-03-15 DIAGNOSIS — E559 Vitamin D deficiency, unspecified: Secondary | ICD-10-CM | POA: Diagnosis not present

## 2022-03-15 DIAGNOSIS — R41841 Cognitive communication deficit: Secondary | ICD-10-CM | POA: Diagnosis not present

## 2022-03-15 DIAGNOSIS — E876 Hypokalemia: Secondary | ICD-10-CM | POA: Diagnosis not present

## 2022-03-15 DIAGNOSIS — I10 Essential (primary) hypertension: Secondary | ICD-10-CM | POA: Diagnosis not present

## 2022-03-15 DIAGNOSIS — R52 Pain, unspecified: Secondary | ICD-10-CM | POA: Diagnosis not present

## 2022-03-15 DIAGNOSIS — R1312 Dysphagia, oropharyngeal phase: Secondary | ICD-10-CM | POA: Diagnosis not present

## 2022-03-15 DIAGNOSIS — M810 Age-related osteoporosis without current pathological fracture: Secondary | ICD-10-CM | POA: Diagnosis not present

## 2022-03-15 DIAGNOSIS — R278 Other lack of coordination: Secondary | ICD-10-CM | POA: Diagnosis not present

## 2022-03-15 DIAGNOSIS — I2510 Atherosclerotic heart disease of native coronary artery without angina pectoris: Secondary | ICD-10-CM | POA: Diagnosis not present

## 2022-03-15 DIAGNOSIS — Z96652 Presence of left artificial knee joint: Secondary | ICD-10-CM | POA: Diagnosis not present

## 2022-03-15 DIAGNOSIS — R296 Repeated falls: Secondary | ICD-10-CM | POA: Diagnosis not present

## 2022-03-15 DIAGNOSIS — D519 Vitamin B12 deficiency anemia, unspecified: Secondary | ICD-10-CM | POA: Diagnosis not present

## 2022-03-15 DIAGNOSIS — E785 Hyperlipidemia, unspecified: Secondary | ICD-10-CM | POA: Diagnosis not present

## 2022-03-15 DIAGNOSIS — F03911 Unspecified dementia, unspecified severity, with agitation: Secondary | ICD-10-CM | POA: Diagnosis not present

## 2022-03-16 DIAGNOSIS — R1312 Dysphagia, oropharyngeal phase: Secondary | ICD-10-CM | POA: Diagnosis not present

## 2022-03-16 DIAGNOSIS — F0390 Unspecified dementia without behavioral disturbance: Secondary | ICD-10-CM | POA: Diagnosis not present

## 2022-03-16 DIAGNOSIS — R41841 Cognitive communication deficit: Secondary | ICD-10-CM | POA: Diagnosis not present

## 2022-03-16 DIAGNOSIS — E785 Hyperlipidemia, unspecified: Secondary | ICD-10-CM | POA: Diagnosis not present

## 2022-03-16 DIAGNOSIS — R52 Pain, unspecified: Secondary | ICD-10-CM | POA: Diagnosis not present

## 2022-03-16 DIAGNOSIS — Z96652 Presence of left artificial knee joint: Secondary | ICD-10-CM | POA: Diagnosis not present

## 2022-03-17 DIAGNOSIS — Z79899 Other long term (current) drug therapy: Secondary | ICD-10-CM | POA: Diagnosis not present

## 2022-03-17 DIAGNOSIS — F0390 Unspecified dementia without behavioral disturbance: Secondary | ICD-10-CM | POA: Diagnosis not present

## 2022-03-17 DIAGNOSIS — R52 Pain, unspecified: Secondary | ICD-10-CM | POA: Diagnosis not present

## 2022-03-17 DIAGNOSIS — R1312 Dysphagia, oropharyngeal phase: Secondary | ICD-10-CM | POA: Diagnosis not present

## 2022-03-17 DIAGNOSIS — E785 Hyperlipidemia, unspecified: Secondary | ICD-10-CM | POA: Diagnosis not present

## 2022-03-17 DIAGNOSIS — Z96652 Presence of left artificial knee joint: Secondary | ICD-10-CM | POA: Diagnosis not present

## 2022-03-17 DIAGNOSIS — R41841 Cognitive communication deficit: Secondary | ICD-10-CM | POA: Diagnosis not present

## 2022-03-20 DIAGNOSIS — Z96652 Presence of left artificial knee joint: Secondary | ICD-10-CM | POA: Diagnosis not present

## 2022-03-20 DIAGNOSIS — E785 Hyperlipidemia, unspecified: Secondary | ICD-10-CM | POA: Diagnosis not present

## 2022-03-20 DIAGNOSIS — R41841 Cognitive communication deficit: Secondary | ICD-10-CM | POA: Diagnosis not present

## 2022-03-20 DIAGNOSIS — F0390 Unspecified dementia without behavioral disturbance: Secondary | ICD-10-CM | POA: Diagnosis not present

## 2022-03-20 DIAGNOSIS — R1312 Dysphagia, oropharyngeal phase: Secondary | ICD-10-CM | POA: Diagnosis not present

## 2022-03-20 DIAGNOSIS — R52 Pain, unspecified: Secondary | ICD-10-CM | POA: Diagnosis not present

## 2022-03-23 DIAGNOSIS — G4700 Insomnia, unspecified: Secondary | ICD-10-CM | POA: Diagnosis not present

## 2022-03-27 DIAGNOSIS — E785 Hyperlipidemia, unspecified: Secondary | ICD-10-CM | POA: Diagnosis not present

## 2022-03-27 DIAGNOSIS — R52 Pain, unspecified: Secondary | ICD-10-CM | POA: Diagnosis not present

## 2022-03-27 DIAGNOSIS — R41841 Cognitive communication deficit: Secondary | ICD-10-CM | POA: Diagnosis not present

## 2022-03-27 DIAGNOSIS — Z96652 Presence of left artificial knee joint: Secondary | ICD-10-CM | POA: Diagnosis not present

## 2022-03-27 DIAGNOSIS — F0390 Unspecified dementia without behavioral disturbance: Secondary | ICD-10-CM | POA: Diagnosis not present

## 2022-03-27 DIAGNOSIS — R1312 Dysphagia, oropharyngeal phase: Secondary | ICD-10-CM | POA: Diagnosis not present

## 2022-03-28 DIAGNOSIS — E785 Hyperlipidemia, unspecified: Secondary | ICD-10-CM | POA: Diagnosis not present

## 2022-03-28 DIAGNOSIS — F0390 Unspecified dementia without behavioral disturbance: Secondary | ICD-10-CM | POA: Diagnosis not present

## 2022-03-28 DIAGNOSIS — R52 Pain, unspecified: Secondary | ICD-10-CM | POA: Diagnosis not present

## 2022-03-28 DIAGNOSIS — R1312 Dysphagia, oropharyngeal phase: Secondary | ICD-10-CM | POA: Diagnosis not present

## 2022-03-28 DIAGNOSIS — R41841 Cognitive communication deficit: Secondary | ICD-10-CM | POA: Diagnosis not present

## 2022-03-28 DIAGNOSIS — Z96652 Presence of left artificial knee joint: Secondary | ICD-10-CM | POA: Diagnosis not present

## 2022-03-29 DIAGNOSIS — Z96652 Presence of left artificial knee joint: Secondary | ICD-10-CM | POA: Diagnosis not present

## 2022-03-29 DIAGNOSIS — F0390 Unspecified dementia without behavioral disturbance: Secondary | ICD-10-CM | POA: Diagnosis not present

## 2022-03-29 DIAGNOSIS — E785 Hyperlipidemia, unspecified: Secondary | ICD-10-CM | POA: Diagnosis not present

## 2022-03-29 DIAGNOSIS — R52 Pain, unspecified: Secondary | ICD-10-CM | POA: Diagnosis not present

## 2022-03-29 DIAGNOSIS — R41841 Cognitive communication deficit: Secondary | ICD-10-CM | POA: Diagnosis not present

## 2022-03-29 DIAGNOSIS — R1312 Dysphagia, oropharyngeal phase: Secondary | ICD-10-CM | POA: Diagnosis not present

## 2022-03-30 DIAGNOSIS — F0390 Unspecified dementia without behavioral disturbance: Secondary | ICD-10-CM | POA: Diagnosis not present

## 2022-03-30 DIAGNOSIS — R1312 Dysphagia, oropharyngeal phase: Secondary | ICD-10-CM | POA: Diagnosis not present

## 2022-03-30 DIAGNOSIS — E785 Hyperlipidemia, unspecified: Secondary | ICD-10-CM | POA: Diagnosis not present

## 2022-03-30 DIAGNOSIS — Z96652 Presence of left artificial knee joint: Secondary | ICD-10-CM | POA: Diagnosis not present

## 2022-03-30 DIAGNOSIS — R52 Pain, unspecified: Secondary | ICD-10-CM | POA: Diagnosis not present

## 2022-03-30 DIAGNOSIS — R41841 Cognitive communication deficit: Secondary | ICD-10-CM | POA: Diagnosis not present

## 2022-03-31 DIAGNOSIS — R41841 Cognitive communication deficit: Secondary | ICD-10-CM | POA: Diagnosis not present

## 2022-03-31 DIAGNOSIS — R52 Pain, unspecified: Secondary | ICD-10-CM | POA: Diagnosis not present

## 2022-03-31 DIAGNOSIS — R1312 Dysphagia, oropharyngeal phase: Secondary | ICD-10-CM | POA: Diagnosis not present

## 2022-03-31 DIAGNOSIS — F0390 Unspecified dementia without behavioral disturbance: Secondary | ICD-10-CM | POA: Diagnosis not present

## 2022-03-31 DIAGNOSIS — Z96652 Presence of left artificial knee joint: Secondary | ICD-10-CM | POA: Diagnosis not present

## 2022-03-31 DIAGNOSIS — E785 Hyperlipidemia, unspecified: Secondary | ICD-10-CM | POA: Diagnosis not present

## 2022-04-03 DIAGNOSIS — F0390 Unspecified dementia without behavioral disturbance: Secondary | ICD-10-CM | POA: Diagnosis not present

## 2022-04-03 DIAGNOSIS — R41841 Cognitive communication deficit: Secondary | ICD-10-CM | POA: Diagnosis not present

## 2022-04-03 DIAGNOSIS — R1312 Dysphagia, oropharyngeal phase: Secondary | ICD-10-CM | POA: Diagnosis not present

## 2022-04-03 DIAGNOSIS — E785 Hyperlipidemia, unspecified: Secondary | ICD-10-CM | POA: Diagnosis not present

## 2022-04-03 DIAGNOSIS — Z96652 Presence of left artificial knee joint: Secondary | ICD-10-CM | POA: Diagnosis not present

## 2022-04-03 DIAGNOSIS — R52 Pain, unspecified: Secondary | ICD-10-CM | POA: Diagnosis not present

## 2022-04-04 DIAGNOSIS — R52 Pain, unspecified: Secondary | ICD-10-CM | POA: Diagnosis not present

## 2022-04-04 DIAGNOSIS — F0390 Unspecified dementia without behavioral disturbance: Secondary | ICD-10-CM | POA: Diagnosis not present

## 2022-04-04 DIAGNOSIS — R1312 Dysphagia, oropharyngeal phase: Secondary | ICD-10-CM | POA: Diagnosis not present

## 2022-04-04 DIAGNOSIS — R41841 Cognitive communication deficit: Secondary | ICD-10-CM | POA: Diagnosis not present

## 2022-04-04 DIAGNOSIS — E785 Hyperlipidemia, unspecified: Secondary | ICD-10-CM | POA: Diagnosis not present

## 2022-04-04 DIAGNOSIS — Z96652 Presence of left artificial knee joint: Secondary | ICD-10-CM | POA: Diagnosis not present

## 2022-04-05 DIAGNOSIS — E785 Hyperlipidemia, unspecified: Secondary | ICD-10-CM | POA: Diagnosis not present

## 2022-04-05 DIAGNOSIS — R1312 Dysphagia, oropharyngeal phase: Secondary | ICD-10-CM | POA: Diagnosis not present

## 2022-04-05 DIAGNOSIS — R41841 Cognitive communication deficit: Secondary | ICD-10-CM | POA: Diagnosis not present

## 2022-04-05 DIAGNOSIS — Z96652 Presence of left artificial knee joint: Secondary | ICD-10-CM | POA: Diagnosis not present

## 2022-04-05 DIAGNOSIS — F0390 Unspecified dementia without behavioral disturbance: Secondary | ICD-10-CM | POA: Diagnosis not present

## 2022-04-05 DIAGNOSIS — R52 Pain, unspecified: Secondary | ICD-10-CM | POA: Diagnosis not present

## 2022-04-06 DIAGNOSIS — R41841 Cognitive communication deficit: Secondary | ICD-10-CM | POA: Diagnosis not present

## 2022-04-06 DIAGNOSIS — E785 Hyperlipidemia, unspecified: Secondary | ICD-10-CM | POA: Diagnosis not present

## 2022-04-06 DIAGNOSIS — R52 Pain, unspecified: Secondary | ICD-10-CM | POA: Diagnosis not present

## 2022-04-06 DIAGNOSIS — F0390 Unspecified dementia without behavioral disturbance: Secondary | ICD-10-CM | POA: Diagnosis not present

## 2022-04-06 DIAGNOSIS — R1312 Dysphagia, oropharyngeal phase: Secondary | ICD-10-CM | POA: Diagnosis not present

## 2022-04-06 DIAGNOSIS — Z96652 Presence of left artificial knee joint: Secondary | ICD-10-CM | POA: Diagnosis not present

## 2022-04-07 DIAGNOSIS — Z96652 Presence of left artificial knee joint: Secondary | ICD-10-CM | POA: Diagnosis not present

## 2022-04-07 DIAGNOSIS — R1312 Dysphagia, oropharyngeal phase: Secondary | ICD-10-CM | POA: Diagnosis not present

## 2022-04-07 DIAGNOSIS — R52 Pain, unspecified: Secondary | ICD-10-CM | POA: Diagnosis not present

## 2022-04-07 DIAGNOSIS — R41841 Cognitive communication deficit: Secondary | ICD-10-CM | POA: Diagnosis not present

## 2022-04-07 DIAGNOSIS — E785 Hyperlipidemia, unspecified: Secondary | ICD-10-CM | POA: Diagnosis not present

## 2022-04-07 DIAGNOSIS — F0390 Unspecified dementia without behavioral disturbance: Secondary | ICD-10-CM | POA: Diagnosis not present

## 2022-04-08 DIAGNOSIS — E785 Hyperlipidemia, unspecified: Secondary | ICD-10-CM | POA: Diagnosis not present

## 2022-04-08 DIAGNOSIS — Z96652 Presence of left artificial knee joint: Secondary | ICD-10-CM | POA: Diagnosis not present

## 2022-04-08 DIAGNOSIS — R41841 Cognitive communication deficit: Secondary | ICD-10-CM | POA: Diagnosis not present

## 2022-04-08 DIAGNOSIS — R1312 Dysphagia, oropharyngeal phase: Secondary | ICD-10-CM | POA: Diagnosis not present

## 2022-04-08 DIAGNOSIS — R52 Pain, unspecified: Secondary | ICD-10-CM | POA: Diagnosis not present

## 2022-04-08 DIAGNOSIS — F0390 Unspecified dementia without behavioral disturbance: Secondary | ICD-10-CM | POA: Diagnosis not present

## 2022-04-10 DIAGNOSIS — Z96652 Presence of left artificial knee joint: Secondary | ICD-10-CM | POA: Diagnosis not present

## 2022-04-10 DIAGNOSIS — E785 Hyperlipidemia, unspecified: Secondary | ICD-10-CM | POA: Diagnosis not present

## 2022-04-10 DIAGNOSIS — F0390 Unspecified dementia without behavioral disturbance: Secondary | ICD-10-CM | POA: Diagnosis not present

## 2022-04-10 DIAGNOSIS — R52 Pain, unspecified: Secondary | ICD-10-CM | POA: Diagnosis not present

## 2022-04-10 DIAGNOSIS — R41841 Cognitive communication deficit: Secondary | ICD-10-CM | POA: Diagnosis not present

## 2022-04-10 DIAGNOSIS — R1312 Dysphagia, oropharyngeal phase: Secondary | ICD-10-CM | POA: Diagnosis not present

## 2022-04-11 DIAGNOSIS — Z96652 Presence of left artificial knee joint: Secondary | ICD-10-CM | POA: Diagnosis not present

## 2022-04-11 DIAGNOSIS — R41841 Cognitive communication deficit: Secondary | ICD-10-CM | POA: Diagnosis not present

## 2022-04-11 DIAGNOSIS — R1312 Dysphagia, oropharyngeal phase: Secondary | ICD-10-CM | POA: Diagnosis not present

## 2022-04-11 DIAGNOSIS — R52 Pain, unspecified: Secondary | ICD-10-CM | POA: Diagnosis not present

## 2022-04-11 DIAGNOSIS — E785 Hyperlipidemia, unspecified: Secondary | ICD-10-CM | POA: Diagnosis not present

## 2022-04-11 DIAGNOSIS — F0390 Unspecified dementia without behavioral disturbance: Secondary | ICD-10-CM | POA: Diagnosis not present

## 2022-04-12 DIAGNOSIS — R52 Pain, unspecified: Secondary | ICD-10-CM | POA: Diagnosis not present

## 2022-04-12 DIAGNOSIS — R1312 Dysphagia, oropharyngeal phase: Secondary | ICD-10-CM | POA: Diagnosis not present

## 2022-04-12 DIAGNOSIS — E785 Hyperlipidemia, unspecified: Secondary | ICD-10-CM | POA: Diagnosis not present

## 2022-04-12 DIAGNOSIS — F0390 Unspecified dementia without behavioral disturbance: Secondary | ICD-10-CM | POA: Diagnosis not present

## 2022-04-12 DIAGNOSIS — Z96652 Presence of left artificial knee joint: Secondary | ICD-10-CM | POA: Diagnosis not present

## 2022-04-12 DIAGNOSIS — R41841 Cognitive communication deficit: Secondary | ICD-10-CM | POA: Diagnosis not present

## 2022-04-13 DIAGNOSIS — E785 Hyperlipidemia, unspecified: Secondary | ICD-10-CM | POA: Diagnosis not present

## 2022-04-13 DIAGNOSIS — G4700 Insomnia, unspecified: Secondary | ICD-10-CM | POA: Diagnosis not present

## 2022-04-13 DIAGNOSIS — F0390 Unspecified dementia without behavioral disturbance: Secondary | ICD-10-CM | POA: Diagnosis not present

## 2022-04-13 DIAGNOSIS — R1312 Dysphagia, oropharyngeal phase: Secondary | ICD-10-CM | POA: Diagnosis not present

## 2022-04-13 DIAGNOSIS — Z96652 Presence of left artificial knee joint: Secondary | ICD-10-CM | POA: Diagnosis not present

## 2022-04-13 DIAGNOSIS — R41841 Cognitive communication deficit: Secondary | ICD-10-CM | POA: Diagnosis not present

## 2022-04-13 DIAGNOSIS — G300 Alzheimer's disease with early onset: Secondary | ICD-10-CM | POA: Diagnosis not present

## 2022-04-13 DIAGNOSIS — F02B0 Dementia in other diseases classified elsewhere, moderate, without behavioral disturbance, psychotic disturbance, mood disturbance, and anxiety: Secondary | ICD-10-CM | POA: Diagnosis not present

## 2022-04-13 DIAGNOSIS — R52 Pain, unspecified: Secondary | ICD-10-CM | POA: Diagnosis not present

## 2022-04-14 DIAGNOSIS — E785 Hyperlipidemia, unspecified: Secondary | ICD-10-CM | POA: Diagnosis not present

## 2022-04-14 DIAGNOSIS — R52 Pain, unspecified: Secondary | ICD-10-CM | POA: Diagnosis not present

## 2022-04-14 DIAGNOSIS — D519 Vitamin B12 deficiency anemia, unspecified: Secondary | ICD-10-CM | POA: Diagnosis not present

## 2022-04-14 DIAGNOSIS — R278 Other lack of coordination: Secondary | ICD-10-CM | POA: Diagnosis not present

## 2022-04-14 DIAGNOSIS — F03911 Unspecified dementia, unspecified severity, with agitation: Secondary | ICD-10-CM | POA: Diagnosis not present

## 2022-04-14 DIAGNOSIS — F0390 Unspecified dementia without behavioral disturbance: Secondary | ICD-10-CM | POA: Diagnosis not present

## 2022-04-14 DIAGNOSIS — E876 Hypokalemia: Secondary | ICD-10-CM | POA: Diagnosis not present

## 2022-04-14 DIAGNOSIS — R1312 Dysphagia, oropharyngeal phase: Secondary | ICD-10-CM | POA: Diagnosis not present

## 2022-04-14 DIAGNOSIS — R41841 Cognitive communication deficit: Secondary | ICD-10-CM | POA: Diagnosis not present

## 2022-04-14 DIAGNOSIS — K5900 Constipation, unspecified: Secondary | ICD-10-CM | POA: Diagnosis not present

## 2022-04-14 DIAGNOSIS — I2510 Atherosclerotic heart disease of native coronary artery without angina pectoris: Secondary | ICD-10-CM | POA: Diagnosis not present

## 2022-04-14 DIAGNOSIS — I10 Essential (primary) hypertension: Secondary | ICD-10-CM | POA: Diagnosis not present

## 2022-04-14 DIAGNOSIS — Z736 Limitation of activities due to disability: Secondary | ICD-10-CM | POA: Diagnosis not present

## 2022-04-14 DIAGNOSIS — R296 Repeated falls: Secondary | ICD-10-CM | POA: Diagnosis not present

## 2022-04-14 DIAGNOSIS — Z96652 Presence of left artificial knee joint: Secondary | ICD-10-CM | POA: Diagnosis not present

## 2022-04-14 DIAGNOSIS — M6259 Muscle wasting and atrophy, not elsewhere classified, multiple sites: Secondary | ICD-10-CM | POA: Diagnosis not present

## 2022-04-14 DIAGNOSIS — E559 Vitamin D deficiency, unspecified: Secondary | ICD-10-CM | POA: Diagnosis not present

## 2022-04-14 DIAGNOSIS — M810 Age-related osteoporosis without current pathological fracture: Secondary | ICD-10-CM | POA: Diagnosis not present

## 2022-04-17 DIAGNOSIS — R41841 Cognitive communication deficit: Secondary | ICD-10-CM | POA: Diagnosis not present

## 2022-04-17 DIAGNOSIS — F0390 Unspecified dementia without behavioral disturbance: Secondary | ICD-10-CM | POA: Diagnosis not present

## 2022-04-17 DIAGNOSIS — Z96652 Presence of left artificial knee joint: Secondary | ICD-10-CM | POA: Diagnosis not present

## 2022-04-17 DIAGNOSIS — N952 Postmenopausal atrophic vaginitis: Secondary | ICD-10-CM | POA: Diagnosis not present

## 2022-04-17 DIAGNOSIS — B3731 Acute candidiasis of vulva and vagina: Secondary | ICD-10-CM | POA: Diagnosis not present

## 2022-04-17 DIAGNOSIS — R52 Pain, unspecified: Secondary | ICD-10-CM | POA: Diagnosis not present

## 2022-04-17 DIAGNOSIS — N390 Urinary tract infection, site not specified: Secondary | ICD-10-CM | POA: Diagnosis not present

## 2022-04-17 DIAGNOSIS — E785 Hyperlipidemia, unspecified: Secondary | ICD-10-CM | POA: Diagnosis not present

## 2022-04-17 DIAGNOSIS — R1312 Dysphagia, oropharyngeal phase: Secondary | ICD-10-CM | POA: Diagnosis not present

## 2022-04-17 DIAGNOSIS — R350 Frequency of micturition: Secondary | ICD-10-CM | POA: Diagnosis not present

## 2022-04-18 DIAGNOSIS — Z96652 Presence of left artificial knee joint: Secondary | ICD-10-CM | POA: Diagnosis not present

## 2022-04-18 DIAGNOSIS — F0390 Unspecified dementia without behavioral disturbance: Secondary | ICD-10-CM | POA: Diagnosis not present

## 2022-04-18 DIAGNOSIS — R41841 Cognitive communication deficit: Secondary | ICD-10-CM | POA: Diagnosis not present

## 2022-04-18 DIAGNOSIS — R1312 Dysphagia, oropharyngeal phase: Secondary | ICD-10-CM | POA: Diagnosis not present

## 2022-04-18 DIAGNOSIS — E785 Hyperlipidemia, unspecified: Secondary | ICD-10-CM | POA: Diagnosis not present

## 2022-04-18 DIAGNOSIS — R52 Pain, unspecified: Secondary | ICD-10-CM | POA: Diagnosis not present

## 2022-04-19 DIAGNOSIS — R1312 Dysphagia, oropharyngeal phase: Secondary | ICD-10-CM | POA: Diagnosis not present

## 2022-04-19 DIAGNOSIS — E785 Hyperlipidemia, unspecified: Secondary | ICD-10-CM | POA: Diagnosis not present

## 2022-04-19 DIAGNOSIS — F0390 Unspecified dementia without behavioral disturbance: Secondary | ICD-10-CM | POA: Diagnosis not present

## 2022-04-19 DIAGNOSIS — R52 Pain, unspecified: Secondary | ICD-10-CM | POA: Diagnosis not present

## 2022-04-19 DIAGNOSIS — R41841 Cognitive communication deficit: Secondary | ICD-10-CM | POA: Diagnosis not present

## 2022-04-19 DIAGNOSIS — Z96652 Presence of left artificial knee joint: Secondary | ICD-10-CM | POA: Diagnosis not present

## 2022-04-20 DIAGNOSIS — Z96652 Presence of left artificial knee joint: Secondary | ICD-10-CM | POA: Diagnosis not present

## 2022-04-20 DIAGNOSIS — F0390 Unspecified dementia without behavioral disturbance: Secondary | ICD-10-CM | POA: Diagnosis not present

## 2022-04-20 DIAGNOSIS — E785 Hyperlipidemia, unspecified: Secondary | ICD-10-CM | POA: Diagnosis not present

## 2022-04-20 DIAGNOSIS — R1312 Dysphagia, oropharyngeal phase: Secondary | ICD-10-CM | POA: Diagnosis not present

## 2022-04-20 DIAGNOSIS — R41841 Cognitive communication deficit: Secondary | ICD-10-CM | POA: Diagnosis not present

## 2022-04-20 DIAGNOSIS — R52 Pain, unspecified: Secondary | ICD-10-CM | POA: Diagnosis not present

## 2022-04-21 DIAGNOSIS — R1312 Dysphagia, oropharyngeal phase: Secondary | ICD-10-CM | POA: Diagnosis not present

## 2022-04-21 DIAGNOSIS — F0390 Unspecified dementia without behavioral disturbance: Secondary | ICD-10-CM | POA: Diagnosis not present

## 2022-04-21 DIAGNOSIS — Z96652 Presence of left artificial knee joint: Secondary | ICD-10-CM | POA: Diagnosis not present

## 2022-04-21 DIAGNOSIS — E785 Hyperlipidemia, unspecified: Secondary | ICD-10-CM | POA: Diagnosis not present

## 2022-04-21 DIAGNOSIS — R52 Pain, unspecified: Secondary | ICD-10-CM | POA: Diagnosis not present

## 2022-04-21 DIAGNOSIS — R41841 Cognitive communication deficit: Secondary | ICD-10-CM | POA: Diagnosis not present

## 2022-04-24 DIAGNOSIS — Z96652 Presence of left artificial knee joint: Secondary | ICD-10-CM | POA: Diagnosis not present

## 2022-04-24 DIAGNOSIS — R1312 Dysphagia, oropharyngeal phase: Secondary | ICD-10-CM | POA: Diagnosis not present

## 2022-04-24 DIAGNOSIS — F0390 Unspecified dementia without behavioral disturbance: Secondary | ICD-10-CM | POA: Diagnosis not present

## 2022-04-24 DIAGNOSIS — E785 Hyperlipidemia, unspecified: Secondary | ICD-10-CM | POA: Diagnosis not present

## 2022-04-24 DIAGNOSIS — R41841 Cognitive communication deficit: Secondary | ICD-10-CM | POA: Diagnosis not present

## 2022-04-24 DIAGNOSIS — R52 Pain, unspecified: Secondary | ICD-10-CM | POA: Diagnosis not present

## 2022-04-25 DIAGNOSIS — R41841 Cognitive communication deficit: Secondary | ICD-10-CM | POA: Diagnosis not present

## 2022-04-25 DIAGNOSIS — R1312 Dysphagia, oropharyngeal phase: Secondary | ICD-10-CM | POA: Diagnosis not present

## 2022-04-25 DIAGNOSIS — F0390 Unspecified dementia without behavioral disturbance: Secondary | ICD-10-CM | POA: Diagnosis not present

## 2022-04-25 DIAGNOSIS — E785 Hyperlipidemia, unspecified: Secondary | ICD-10-CM | POA: Diagnosis not present

## 2022-04-25 DIAGNOSIS — R52 Pain, unspecified: Secondary | ICD-10-CM | POA: Diagnosis not present

## 2022-04-25 DIAGNOSIS — Z96652 Presence of left artificial knee joint: Secondary | ICD-10-CM | POA: Diagnosis not present

## 2022-04-26 DIAGNOSIS — R1312 Dysphagia, oropharyngeal phase: Secondary | ICD-10-CM | POA: Diagnosis not present

## 2022-04-26 DIAGNOSIS — E785 Hyperlipidemia, unspecified: Secondary | ICD-10-CM | POA: Diagnosis not present

## 2022-04-26 DIAGNOSIS — R52 Pain, unspecified: Secondary | ICD-10-CM | POA: Diagnosis not present

## 2022-04-26 DIAGNOSIS — F0390 Unspecified dementia without behavioral disturbance: Secondary | ICD-10-CM | POA: Diagnosis not present

## 2022-04-26 DIAGNOSIS — Z96652 Presence of left artificial knee joint: Secondary | ICD-10-CM | POA: Diagnosis not present

## 2022-04-26 DIAGNOSIS — R41841 Cognitive communication deficit: Secondary | ICD-10-CM | POA: Diagnosis not present

## 2022-04-27 DIAGNOSIS — E785 Hyperlipidemia, unspecified: Secondary | ICD-10-CM | POA: Diagnosis not present

## 2022-04-27 DIAGNOSIS — R52 Pain, unspecified: Secondary | ICD-10-CM | POA: Diagnosis not present

## 2022-04-27 DIAGNOSIS — R1312 Dysphagia, oropharyngeal phase: Secondary | ICD-10-CM | POA: Diagnosis not present

## 2022-04-27 DIAGNOSIS — F0390 Unspecified dementia without behavioral disturbance: Secondary | ICD-10-CM | POA: Diagnosis not present

## 2022-04-27 DIAGNOSIS — R41841 Cognitive communication deficit: Secondary | ICD-10-CM | POA: Diagnosis not present

## 2022-04-27 DIAGNOSIS — Z96652 Presence of left artificial knee joint: Secondary | ICD-10-CM | POA: Diagnosis not present

## 2022-04-28 DIAGNOSIS — R41841 Cognitive communication deficit: Secondary | ICD-10-CM | POA: Diagnosis not present

## 2022-04-28 DIAGNOSIS — E785 Hyperlipidemia, unspecified: Secondary | ICD-10-CM | POA: Diagnosis not present

## 2022-04-28 DIAGNOSIS — R1312 Dysphagia, oropharyngeal phase: Secondary | ICD-10-CM | POA: Diagnosis not present

## 2022-04-28 DIAGNOSIS — Z96652 Presence of left artificial knee joint: Secondary | ICD-10-CM | POA: Diagnosis not present

## 2022-04-28 DIAGNOSIS — F0390 Unspecified dementia without behavioral disturbance: Secondary | ICD-10-CM | POA: Diagnosis not present

## 2022-04-28 DIAGNOSIS — R52 Pain, unspecified: Secondary | ICD-10-CM | POA: Diagnosis not present

## 2022-05-01 DIAGNOSIS — R41841 Cognitive communication deficit: Secondary | ICD-10-CM | POA: Diagnosis not present

## 2022-05-01 DIAGNOSIS — Z96652 Presence of left artificial knee joint: Secondary | ICD-10-CM | POA: Diagnosis not present

## 2022-05-01 DIAGNOSIS — E785 Hyperlipidemia, unspecified: Secondary | ICD-10-CM | POA: Diagnosis not present

## 2022-05-01 DIAGNOSIS — R52 Pain, unspecified: Secondary | ICD-10-CM | POA: Diagnosis not present

## 2022-05-01 DIAGNOSIS — F0390 Unspecified dementia without behavioral disturbance: Secondary | ICD-10-CM | POA: Diagnosis not present

## 2022-05-01 DIAGNOSIS — R1312 Dysphagia, oropharyngeal phase: Secondary | ICD-10-CM | POA: Diagnosis not present

## 2022-05-02 DIAGNOSIS — R1312 Dysphagia, oropharyngeal phase: Secondary | ICD-10-CM | POA: Diagnosis not present

## 2022-05-02 DIAGNOSIS — F0390 Unspecified dementia without behavioral disturbance: Secondary | ICD-10-CM | POA: Diagnosis not present

## 2022-05-02 DIAGNOSIS — R52 Pain, unspecified: Secondary | ICD-10-CM | POA: Diagnosis not present

## 2022-05-02 DIAGNOSIS — Z96652 Presence of left artificial knee joint: Secondary | ICD-10-CM | POA: Diagnosis not present

## 2022-05-02 DIAGNOSIS — E785 Hyperlipidemia, unspecified: Secondary | ICD-10-CM | POA: Diagnosis not present

## 2022-05-02 DIAGNOSIS — R41841 Cognitive communication deficit: Secondary | ICD-10-CM | POA: Diagnosis not present

## 2022-05-03 DIAGNOSIS — E785 Hyperlipidemia, unspecified: Secondary | ICD-10-CM | POA: Diagnosis not present

## 2022-05-03 DIAGNOSIS — R52 Pain, unspecified: Secondary | ICD-10-CM | POA: Diagnosis not present

## 2022-05-03 DIAGNOSIS — F0390 Unspecified dementia without behavioral disturbance: Secondary | ICD-10-CM | POA: Diagnosis not present

## 2022-05-03 DIAGNOSIS — R1312 Dysphagia, oropharyngeal phase: Secondary | ICD-10-CM | POA: Diagnosis not present

## 2022-05-03 DIAGNOSIS — Z96652 Presence of left artificial knee joint: Secondary | ICD-10-CM | POA: Diagnosis not present

## 2022-05-03 DIAGNOSIS — R41841 Cognitive communication deficit: Secondary | ICD-10-CM | POA: Diagnosis not present

## 2022-05-04 DIAGNOSIS — Z96652 Presence of left artificial knee joint: Secondary | ICD-10-CM | POA: Diagnosis not present

## 2022-05-04 DIAGNOSIS — R52 Pain, unspecified: Secondary | ICD-10-CM | POA: Diagnosis not present

## 2022-05-04 DIAGNOSIS — R41841 Cognitive communication deficit: Secondary | ICD-10-CM | POA: Diagnosis not present

## 2022-05-04 DIAGNOSIS — R1312 Dysphagia, oropharyngeal phase: Secondary | ICD-10-CM | POA: Diagnosis not present

## 2022-05-04 DIAGNOSIS — E785 Hyperlipidemia, unspecified: Secondary | ICD-10-CM | POA: Diagnosis not present

## 2022-05-04 DIAGNOSIS — F0390 Unspecified dementia without behavioral disturbance: Secondary | ICD-10-CM | POA: Diagnosis not present

## 2022-05-08 DIAGNOSIS — N390 Urinary tract infection, site not specified: Secondary | ICD-10-CM | POA: Diagnosis not present

## 2022-05-09 DIAGNOSIS — E559 Vitamin D deficiency, unspecified: Secondary | ICD-10-CM | POA: Diagnosis not present

## 2022-05-09 DIAGNOSIS — I2510 Atherosclerotic heart disease of native coronary artery without angina pectoris: Secondary | ICD-10-CM | POA: Diagnosis not present

## 2022-05-09 DIAGNOSIS — I10 Essential (primary) hypertension: Secondary | ICD-10-CM | POA: Diagnosis not present

## 2022-05-09 DIAGNOSIS — G309 Alzheimer's disease, unspecified: Secondary | ICD-10-CM | POA: Diagnosis not present

## 2022-05-11 DIAGNOSIS — G300 Alzheimer's disease with early onset: Secondary | ICD-10-CM | POA: Diagnosis not present

## 2022-05-11 DIAGNOSIS — F02B0 Dementia in other diseases classified elsewhere, moderate, without behavioral disturbance, psychotic disturbance, mood disturbance, and anxiety: Secondary | ICD-10-CM | POA: Diagnosis not present

## 2022-05-17 DIAGNOSIS — R051 Acute cough: Secondary | ICD-10-CM | POA: Diagnosis not present

## 2022-05-31 DIAGNOSIS — N390 Urinary tract infection, site not specified: Secondary | ICD-10-CM | POA: Diagnosis not present

## 2022-05-31 DIAGNOSIS — B3731 Acute candidiasis of vulva and vagina: Secondary | ICD-10-CM | POA: Diagnosis not present

## 2022-05-31 DIAGNOSIS — R350 Frequency of micturition: Secondary | ICD-10-CM | POA: Diagnosis not present

## 2022-06-01 DIAGNOSIS — D519 Vitamin B12 deficiency anemia, unspecified: Secondary | ICD-10-CM | POA: Diagnosis not present

## 2022-06-01 DIAGNOSIS — Z20828 Contact with and (suspected) exposure to other viral communicable diseases: Secondary | ICD-10-CM | POA: Diagnosis not present

## 2022-06-01 DIAGNOSIS — E559 Vitamin D deficiency, unspecified: Secondary | ICD-10-CM | POA: Diagnosis not present

## 2022-06-01 DIAGNOSIS — J069 Acute upper respiratory infection, unspecified: Secondary | ICD-10-CM | POA: Diagnosis not present

## 2022-06-01 DIAGNOSIS — F0390 Unspecified dementia without behavioral disturbance: Secondary | ICD-10-CM | POA: Diagnosis not present

## 2022-06-01 DIAGNOSIS — R278 Other lack of coordination: Secondary | ICD-10-CM | POA: Diagnosis not present

## 2022-06-01 DIAGNOSIS — R1312 Dysphagia, oropharyngeal phase: Secondary | ICD-10-CM | POA: Diagnosis not present

## 2022-06-01 DIAGNOSIS — Z96652 Presence of left artificial knee joint: Secondary | ICD-10-CM | POA: Diagnosis not present

## 2022-06-01 DIAGNOSIS — F03911 Unspecified dementia, unspecified severity, with agitation: Secondary | ICD-10-CM | POA: Diagnosis not present

## 2022-06-01 DIAGNOSIS — R296 Repeated falls: Secondary | ICD-10-CM | POA: Diagnosis not present

## 2022-06-01 DIAGNOSIS — K5900 Constipation, unspecified: Secondary | ICD-10-CM | POA: Diagnosis not present

## 2022-06-01 DIAGNOSIS — Z736 Limitation of activities due to disability: Secondary | ICD-10-CM | POA: Diagnosis not present

## 2022-06-01 DIAGNOSIS — E876 Hypokalemia: Secondary | ICD-10-CM | POA: Diagnosis not present

## 2022-06-01 DIAGNOSIS — E785 Hyperlipidemia, unspecified: Secondary | ICD-10-CM | POA: Diagnosis not present

## 2022-06-01 DIAGNOSIS — I2510 Atherosclerotic heart disease of native coronary artery without angina pectoris: Secondary | ICD-10-CM | POA: Diagnosis not present

## 2022-06-01 DIAGNOSIS — R41841 Cognitive communication deficit: Secondary | ICD-10-CM | POA: Diagnosis not present

## 2022-06-01 DIAGNOSIS — M810 Age-related osteoporosis without current pathological fracture: Secondary | ICD-10-CM | POA: Diagnosis not present

## 2022-06-01 DIAGNOSIS — I10 Essential (primary) hypertension: Secondary | ICD-10-CM | POA: Diagnosis not present

## 2022-06-01 DIAGNOSIS — R52 Pain, unspecified: Secondary | ICD-10-CM | POA: Diagnosis not present

## 2022-06-01 DIAGNOSIS — M6259 Muscle wasting and atrophy, not elsewhere classified, multiple sites: Secondary | ICD-10-CM | POA: Diagnosis not present

## 2022-06-02 DIAGNOSIS — R41841 Cognitive communication deficit: Secondary | ICD-10-CM | POA: Diagnosis not present

## 2022-06-02 DIAGNOSIS — Z96652 Presence of left artificial knee joint: Secondary | ICD-10-CM | POA: Diagnosis not present

## 2022-06-02 DIAGNOSIS — M6259 Muscle wasting and atrophy, not elsewhere classified, multiple sites: Secondary | ICD-10-CM | POA: Diagnosis not present

## 2022-06-02 DIAGNOSIS — R52 Pain, unspecified: Secondary | ICD-10-CM | POA: Diagnosis not present

## 2022-06-02 DIAGNOSIS — E785 Hyperlipidemia, unspecified: Secondary | ICD-10-CM | POA: Diagnosis not present

## 2022-06-02 DIAGNOSIS — F0390 Unspecified dementia without behavioral disturbance: Secondary | ICD-10-CM | POA: Diagnosis not present

## 2022-06-05 DIAGNOSIS — F0390 Unspecified dementia without behavioral disturbance: Secondary | ICD-10-CM | POA: Diagnosis not present

## 2022-06-05 DIAGNOSIS — Z96652 Presence of left artificial knee joint: Secondary | ICD-10-CM | POA: Diagnosis not present

## 2022-06-05 DIAGNOSIS — R52 Pain, unspecified: Secondary | ICD-10-CM | POA: Diagnosis not present

## 2022-06-05 DIAGNOSIS — E785 Hyperlipidemia, unspecified: Secondary | ICD-10-CM | POA: Diagnosis not present

## 2022-06-05 DIAGNOSIS — R41841 Cognitive communication deficit: Secondary | ICD-10-CM | POA: Diagnosis not present

## 2022-06-05 DIAGNOSIS — M6259 Muscle wasting and atrophy, not elsewhere classified, multiple sites: Secondary | ICD-10-CM | POA: Diagnosis not present

## 2022-06-06 DIAGNOSIS — F0390 Unspecified dementia without behavioral disturbance: Secondary | ICD-10-CM | POA: Diagnosis not present

## 2022-06-06 DIAGNOSIS — R41841 Cognitive communication deficit: Secondary | ICD-10-CM | POA: Diagnosis not present

## 2022-06-06 DIAGNOSIS — Z96652 Presence of left artificial knee joint: Secondary | ICD-10-CM | POA: Diagnosis not present

## 2022-06-06 DIAGNOSIS — E785 Hyperlipidemia, unspecified: Secondary | ICD-10-CM | POA: Diagnosis not present

## 2022-06-06 DIAGNOSIS — R52 Pain, unspecified: Secondary | ICD-10-CM | POA: Diagnosis not present

## 2022-06-06 DIAGNOSIS — M6259 Muscle wasting and atrophy, not elsewhere classified, multiple sites: Secondary | ICD-10-CM | POA: Diagnosis not present

## 2022-06-07 DIAGNOSIS — R41841 Cognitive communication deficit: Secondary | ICD-10-CM | POA: Diagnosis not present

## 2022-06-07 DIAGNOSIS — Z96652 Presence of left artificial knee joint: Secondary | ICD-10-CM | POA: Diagnosis not present

## 2022-06-07 DIAGNOSIS — M6259 Muscle wasting and atrophy, not elsewhere classified, multiple sites: Secondary | ICD-10-CM | POA: Diagnosis not present

## 2022-06-07 DIAGNOSIS — F0390 Unspecified dementia without behavioral disturbance: Secondary | ICD-10-CM | POA: Diagnosis not present

## 2022-06-07 DIAGNOSIS — E785 Hyperlipidemia, unspecified: Secondary | ICD-10-CM | POA: Diagnosis not present

## 2022-06-07 DIAGNOSIS — R52 Pain, unspecified: Secondary | ICD-10-CM | POA: Diagnosis not present

## 2022-06-08 DIAGNOSIS — M6259 Muscle wasting and atrophy, not elsewhere classified, multiple sites: Secondary | ICD-10-CM | POA: Diagnosis not present

## 2022-06-08 DIAGNOSIS — R52 Pain, unspecified: Secondary | ICD-10-CM | POA: Diagnosis not present

## 2022-06-08 DIAGNOSIS — Z96652 Presence of left artificial knee joint: Secondary | ICD-10-CM | POA: Diagnosis not present

## 2022-06-08 DIAGNOSIS — E785 Hyperlipidemia, unspecified: Secondary | ICD-10-CM | POA: Diagnosis not present

## 2022-06-08 DIAGNOSIS — E119 Type 2 diabetes mellitus without complications: Secondary | ICD-10-CM | POA: Diagnosis not present

## 2022-06-08 DIAGNOSIS — I10 Essential (primary) hypertension: Secondary | ICD-10-CM | POA: Diagnosis not present

## 2022-06-08 DIAGNOSIS — E876 Hypokalemia: Secondary | ICD-10-CM | POA: Diagnosis not present

## 2022-06-08 DIAGNOSIS — R41841 Cognitive communication deficit: Secondary | ICD-10-CM | POA: Diagnosis not present

## 2022-06-08 DIAGNOSIS — F0390 Unspecified dementia without behavioral disturbance: Secondary | ICD-10-CM | POA: Diagnosis not present

## 2022-06-08 DIAGNOSIS — E559 Vitamin D deficiency, unspecified: Secondary | ICD-10-CM | POA: Diagnosis not present

## 2022-06-09 DIAGNOSIS — Z96652 Presence of left artificial knee joint: Secondary | ICD-10-CM | POA: Diagnosis not present

## 2022-06-09 DIAGNOSIS — F0390 Unspecified dementia without behavioral disturbance: Secondary | ICD-10-CM | POA: Diagnosis not present

## 2022-06-09 DIAGNOSIS — R41841 Cognitive communication deficit: Secondary | ICD-10-CM | POA: Diagnosis not present

## 2022-06-09 DIAGNOSIS — E785 Hyperlipidemia, unspecified: Secondary | ICD-10-CM | POA: Diagnosis not present

## 2022-06-09 DIAGNOSIS — R52 Pain, unspecified: Secondary | ICD-10-CM | POA: Diagnosis not present

## 2022-06-09 DIAGNOSIS — M6259 Muscle wasting and atrophy, not elsewhere classified, multiple sites: Secondary | ICD-10-CM | POA: Diagnosis not present

## 2022-06-12 DIAGNOSIS — R52 Pain, unspecified: Secondary | ICD-10-CM | POA: Diagnosis not present

## 2022-06-12 DIAGNOSIS — E785 Hyperlipidemia, unspecified: Secondary | ICD-10-CM | POA: Diagnosis not present

## 2022-06-12 DIAGNOSIS — Z96652 Presence of left artificial knee joint: Secondary | ICD-10-CM | POA: Diagnosis not present

## 2022-06-12 DIAGNOSIS — F0390 Unspecified dementia without behavioral disturbance: Secondary | ICD-10-CM | POA: Diagnosis not present

## 2022-06-12 DIAGNOSIS — R41841 Cognitive communication deficit: Secondary | ICD-10-CM | POA: Diagnosis not present

## 2022-06-12 DIAGNOSIS — M6259 Muscle wasting and atrophy, not elsewhere classified, multiple sites: Secondary | ICD-10-CM | POA: Diagnosis not present

## 2022-06-13 DIAGNOSIS — R52 Pain, unspecified: Secondary | ICD-10-CM | POA: Diagnosis not present

## 2022-06-13 DIAGNOSIS — F0390 Unspecified dementia without behavioral disturbance: Secondary | ICD-10-CM | POA: Diagnosis not present

## 2022-06-13 DIAGNOSIS — M6259 Muscle wasting and atrophy, not elsewhere classified, multiple sites: Secondary | ICD-10-CM | POA: Diagnosis not present

## 2022-06-13 DIAGNOSIS — R41841 Cognitive communication deficit: Secondary | ICD-10-CM | POA: Diagnosis not present

## 2022-06-13 DIAGNOSIS — E785 Hyperlipidemia, unspecified: Secondary | ICD-10-CM | POA: Diagnosis not present

## 2022-06-13 DIAGNOSIS — Z96652 Presence of left artificial knee joint: Secondary | ICD-10-CM | POA: Diagnosis not present

## 2022-06-14 DIAGNOSIS — R52 Pain, unspecified: Secondary | ICD-10-CM | POA: Diagnosis not present

## 2022-06-14 DIAGNOSIS — Z96652 Presence of left artificial knee joint: Secondary | ICD-10-CM | POA: Diagnosis not present

## 2022-06-14 DIAGNOSIS — R41841 Cognitive communication deficit: Secondary | ICD-10-CM | POA: Diagnosis not present

## 2022-06-14 DIAGNOSIS — F0390 Unspecified dementia without behavioral disturbance: Secondary | ICD-10-CM | POA: Diagnosis not present

## 2022-06-14 DIAGNOSIS — M6259 Muscle wasting and atrophy, not elsewhere classified, multiple sites: Secondary | ICD-10-CM | POA: Diagnosis not present

## 2022-06-14 DIAGNOSIS — E785 Hyperlipidemia, unspecified: Secondary | ICD-10-CM | POA: Diagnosis not present

## 2022-06-15 DIAGNOSIS — E559 Vitamin D deficiency, unspecified: Secondary | ICD-10-CM | POA: Diagnosis not present

## 2022-06-15 DIAGNOSIS — R278 Other lack of coordination: Secondary | ICD-10-CM | POA: Diagnosis not present

## 2022-06-15 DIAGNOSIS — F02B0 Dementia in other diseases classified elsewhere, moderate, without behavioral disturbance, psychotic disturbance, mood disturbance, and anxiety: Secondary | ICD-10-CM | POA: Diagnosis not present

## 2022-06-15 DIAGNOSIS — I10 Essential (primary) hypertension: Secondary | ICD-10-CM | POA: Diagnosis not present

## 2022-06-15 DIAGNOSIS — M810 Age-related osteoporosis without current pathological fracture: Secondary | ICD-10-CM | POA: Diagnosis not present

## 2022-06-15 DIAGNOSIS — R1312 Dysphagia, oropharyngeal phase: Secondary | ICD-10-CM | POA: Diagnosis not present

## 2022-06-15 DIAGNOSIS — Z736 Limitation of activities due to disability: Secondary | ICD-10-CM | POA: Diagnosis not present

## 2022-06-15 DIAGNOSIS — R52 Pain, unspecified: Secondary | ICD-10-CM | POA: Diagnosis not present

## 2022-06-15 DIAGNOSIS — G300 Alzheimer's disease with early onset: Secondary | ICD-10-CM | POA: Diagnosis not present

## 2022-06-15 DIAGNOSIS — R41841 Cognitive communication deficit: Secondary | ICD-10-CM | POA: Diagnosis not present

## 2022-06-15 DIAGNOSIS — E876 Hypokalemia: Secondary | ICD-10-CM | POA: Diagnosis not present

## 2022-06-15 DIAGNOSIS — Z96652 Presence of left artificial knee joint: Secondary | ICD-10-CM | POA: Diagnosis not present

## 2022-06-15 DIAGNOSIS — I2510 Atherosclerotic heart disease of native coronary artery without angina pectoris: Secondary | ICD-10-CM | POA: Diagnosis not present

## 2022-06-15 DIAGNOSIS — F0390 Unspecified dementia without behavioral disturbance: Secondary | ICD-10-CM | POA: Diagnosis not present

## 2022-06-15 DIAGNOSIS — F03911 Unspecified dementia, unspecified severity, with agitation: Secondary | ICD-10-CM | POA: Diagnosis not present

## 2022-06-15 DIAGNOSIS — D519 Vitamin B12 deficiency anemia, unspecified: Secondary | ICD-10-CM | POA: Diagnosis not present

## 2022-06-15 DIAGNOSIS — R296 Repeated falls: Secondary | ICD-10-CM | POA: Diagnosis not present

## 2022-06-15 DIAGNOSIS — M6259 Muscle wasting and atrophy, not elsewhere classified, multiple sites: Secondary | ICD-10-CM | POA: Diagnosis not present

## 2022-06-15 DIAGNOSIS — E785 Hyperlipidemia, unspecified: Secondary | ICD-10-CM | POA: Diagnosis not present

## 2022-06-15 DIAGNOSIS — K5900 Constipation, unspecified: Secondary | ICD-10-CM | POA: Diagnosis not present

## 2022-06-16 DIAGNOSIS — F0390 Unspecified dementia without behavioral disturbance: Secondary | ICD-10-CM | POA: Diagnosis not present

## 2022-06-16 DIAGNOSIS — M6259 Muscle wasting and atrophy, not elsewhere classified, multiple sites: Secondary | ICD-10-CM | POA: Diagnosis not present

## 2022-06-16 DIAGNOSIS — E785 Hyperlipidemia, unspecified: Secondary | ICD-10-CM | POA: Diagnosis not present

## 2022-06-16 DIAGNOSIS — R41841 Cognitive communication deficit: Secondary | ICD-10-CM | POA: Diagnosis not present

## 2022-06-16 DIAGNOSIS — Z96652 Presence of left artificial knee joint: Secondary | ICD-10-CM | POA: Diagnosis not present

## 2022-06-16 DIAGNOSIS — R52 Pain, unspecified: Secondary | ICD-10-CM | POA: Diagnosis not present

## 2022-06-17 DIAGNOSIS — R41841 Cognitive communication deficit: Secondary | ICD-10-CM | POA: Diagnosis not present

## 2022-06-17 DIAGNOSIS — E785 Hyperlipidemia, unspecified: Secondary | ICD-10-CM | POA: Diagnosis not present

## 2022-06-17 DIAGNOSIS — Z96652 Presence of left artificial knee joint: Secondary | ICD-10-CM | POA: Diagnosis not present

## 2022-06-17 DIAGNOSIS — F0390 Unspecified dementia without behavioral disturbance: Secondary | ICD-10-CM | POA: Diagnosis not present

## 2022-06-17 DIAGNOSIS — R52 Pain, unspecified: Secondary | ICD-10-CM | POA: Diagnosis not present

## 2022-06-17 DIAGNOSIS — M6259 Muscle wasting and atrophy, not elsewhere classified, multiple sites: Secondary | ICD-10-CM | POA: Diagnosis not present

## 2022-06-19 DIAGNOSIS — Z96652 Presence of left artificial knee joint: Secondary | ICD-10-CM | POA: Diagnosis not present

## 2022-06-19 DIAGNOSIS — R52 Pain, unspecified: Secondary | ICD-10-CM | POA: Diagnosis not present

## 2022-06-19 DIAGNOSIS — F0390 Unspecified dementia without behavioral disturbance: Secondary | ICD-10-CM | POA: Diagnosis not present

## 2022-06-19 DIAGNOSIS — R41841 Cognitive communication deficit: Secondary | ICD-10-CM | POA: Diagnosis not present

## 2022-06-19 DIAGNOSIS — E785 Hyperlipidemia, unspecified: Secondary | ICD-10-CM | POA: Diagnosis not present

## 2022-06-19 DIAGNOSIS — M6259 Muscle wasting and atrophy, not elsewhere classified, multiple sites: Secondary | ICD-10-CM | POA: Diagnosis not present

## 2022-06-20 DIAGNOSIS — F0390 Unspecified dementia without behavioral disturbance: Secondary | ICD-10-CM | POA: Diagnosis not present

## 2022-06-20 DIAGNOSIS — R7303 Prediabetes: Secondary | ICD-10-CM | POA: Diagnosis not present

## 2022-06-20 DIAGNOSIS — G309 Alzheimer's disease, unspecified: Secondary | ICD-10-CM | POA: Diagnosis not present

## 2022-06-20 DIAGNOSIS — M6259 Muscle wasting and atrophy, not elsewhere classified, multiple sites: Secondary | ICD-10-CM | POA: Diagnosis not present

## 2022-06-20 DIAGNOSIS — R52 Pain, unspecified: Secondary | ICD-10-CM | POA: Diagnosis not present

## 2022-06-20 DIAGNOSIS — Z96652 Presence of left artificial knee joint: Secondary | ICD-10-CM | POA: Diagnosis not present

## 2022-06-20 DIAGNOSIS — R41841 Cognitive communication deficit: Secondary | ICD-10-CM | POA: Diagnosis not present

## 2022-06-20 DIAGNOSIS — E785 Hyperlipidemia, unspecified: Secondary | ICD-10-CM | POA: Diagnosis not present

## 2022-06-21 DIAGNOSIS — E785 Hyperlipidemia, unspecified: Secondary | ICD-10-CM | POA: Diagnosis not present

## 2022-06-21 DIAGNOSIS — R41841 Cognitive communication deficit: Secondary | ICD-10-CM | POA: Diagnosis not present

## 2022-06-21 DIAGNOSIS — R52 Pain, unspecified: Secondary | ICD-10-CM | POA: Diagnosis not present

## 2022-06-21 DIAGNOSIS — F0390 Unspecified dementia without behavioral disturbance: Secondary | ICD-10-CM | POA: Diagnosis not present

## 2022-06-21 DIAGNOSIS — Z96652 Presence of left artificial knee joint: Secondary | ICD-10-CM | POA: Diagnosis not present

## 2022-06-21 DIAGNOSIS — M6259 Muscle wasting and atrophy, not elsewhere classified, multiple sites: Secondary | ICD-10-CM | POA: Diagnosis not present

## 2022-06-22 DIAGNOSIS — E785 Hyperlipidemia, unspecified: Secondary | ICD-10-CM | POA: Diagnosis not present

## 2022-06-22 DIAGNOSIS — M6259 Muscle wasting and atrophy, not elsewhere classified, multiple sites: Secondary | ICD-10-CM | POA: Diagnosis not present

## 2022-06-22 DIAGNOSIS — Z96652 Presence of left artificial knee joint: Secondary | ICD-10-CM | POA: Diagnosis not present

## 2022-06-22 DIAGNOSIS — F0390 Unspecified dementia without behavioral disturbance: Secondary | ICD-10-CM | POA: Diagnosis not present

## 2022-06-22 DIAGNOSIS — R52 Pain, unspecified: Secondary | ICD-10-CM | POA: Diagnosis not present

## 2022-06-22 DIAGNOSIS — R41841 Cognitive communication deficit: Secondary | ICD-10-CM | POA: Diagnosis not present

## 2022-06-23 DIAGNOSIS — M6259 Muscle wasting and atrophy, not elsewhere classified, multiple sites: Secondary | ICD-10-CM | POA: Diagnosis not present

## 2022-06-23 DIAGNOSIS — R41841 Cognitive communication deficit: Secondary | ICD-10-CM | POA: Diagnosis not present

## 2022-06-23 DIAGNOSIS — F0390 Unspecified dementia without behavioral disturbance: Secondary | ICD-10-CM | POA: Diagnosis not present

## 2022-06-23 DIAGNOSIS — R52 Pain, unspecified: Secondary | ICD-10-CM | POA: Diagnosis not present

## 2022-06-23 DIAGNOSIS — E785 Hyperlipidemia, unspecified: Secondary | ICD-10-CM | POA: Diagnosis not present

## 2022-06-23 DIAGNOSIS — Z96652 Presence of left artificial knee joint: Secondary | ICD-10-CM | POA: Diagnosis not present

## 2022-06-26 DIAGNOSIS — M6259 Muscle wasting and atrophy, not elsewhere classified, multiple sites: Secondary | ICD-10-CM | POA: Diagnosis not present

## 2022-06-26 DIAGNOSIS — R52 Pain, unspecified: Secondary | ICD-10-CM | POA: Diagnosis not present

## 2022-06-26 DIAGNOSIS — R41841 Cognitive communication deficit: Secondary | ICD-10-CM | POA: Diagnosis not present

## 2022-06-26 DIAGNOSIS — E785 Hyperlipidemia, unspecified: Secondary | ICD-10-CM | POA: Diagnosis not present

## 2022-06-26 DIAGNOSIS — Z96652 Presence of left artificial knee joint: Secondary | ICD-10-CM | POA: Diagnosis not present

## 2022-06-26 DIAGNOSIS — F0390 Unspecified dementia without behavioral disturbance: Secondary | ICD-10-CM | POA: Diagnosis not present

## 2022-06-27 DIAGNOSIS — R41841 Cognitive communication deficit: Secondary | ICD-10-CM | POA: Diagnosis not present

## 2022-06-27 DIAGNOSIS — E785 Hyperlipidemia, unspecified: Secondary | ICD-10-CM | POA: Diagnosis not present

## 2022-06-27 DIAGNOSIS — M6259 Muscle wasting and atrophy, not elsewhere classified, multiple sites: Secondary | ICD-10-CM | POA: Diagnosis not present

## 2022-06-27 DIAGNOSIS — F0390 Unspecified dementia without behavioral disturbance: Secondary | ICD-10-CM | POA: Diagnosis not present

## 2022-06-27 DIAGNOSIS — Z96652 Presence of left artificial knee joint: Secondary | ICD-10-CM | POA: Diagnosis not present

## 2022-06-27 DIAGNOSIS — R52 Pain, unspecified: Secondary | ICD-10-CM | POA: Diagnosis not present

## 2022-06-28 DIAGNOSIS — R41841 Cognitive communication deficit: Secondary | ICD-10-CM | POA: Diagnosis not present

## 2022-06-28 DIAGNOSIS — F0390 Unspecified dementia without behavioral disturbance: Secondary | ICD-10-CM | POA: Diagnosis not present

## 2022-06-28 DIAGNOSIS — E785 Hyperlipidemia, unspecified: Secondary | ICD-10-CM | POA: Diagnosis not present

## 2022-06-28 DIAGNOSIS — M6259 Muscle wasting and atrophy, not elsewhere classified, multiple sites: Secondary | ICD-10-CM | POA: Diagnosis not present

## 2022-06-28 DIAGNOSIS — Z96652 Presence of left artificial knee joint: Secondary | ICD-10-CM | POA: Diagnosis not present

## 2022-06-28 DIAGNOSIS — R52 Pain, unspecified: Secondary | ICD-10-CM | POA: Diagnosis not present

## 2022-06-29 DIAGNOSIS — E785 Hyperlipidemia, unspecified: Secondary | ICD-10-CM | POA: Diagnosis not present

## 2022-06-29 DIAGNOSIS — R41841 Cognitive communication deficit: Secondary | ICD-10-CM | POA: Diagnosis not present

## 2022-06-29 DIAGNOSIS — M6259 Muscle wasting and atrophy, not elsewhere classified, multiple sites: Secondary | ICD-10-CM | POA: Diagnosis not present

## 2022-06-29 DIAGNOSIS — R52 Pain, unspecified: Secondary | ICD-10-CM | POA: Diagnosis not present

## 2022-06-29 DIAGNOSIS — F0390 Unspecified dementia without behavioral disturbance: Secondary | ICD-10-CM | POA: Diagnosis not present

## 2022-06-29 DIAGNOSIS — Z96652 Presence of left artificial knee joint: Secondary | ICD-10-CM | POA: Diagnosis not present

## 2022-06-30 DIAGNOSIS — R41841 Cognitive communication deficit: Secondary | ICD-10-CM | POA: Diagnosis not present

## 2022-06-30 DIAGNOSIS — M6259 Muscle wasting and atrophy, not elsewhere classified, multiple sites: Secondary | ICD-10-CM | POA: Diagnosis not present

## 2022-06-30 DIAGNOSIS — F0390 Unspecified dementia without behavioral disturbance: Secondary | ICD-10-CM | POA: Diagnosis not present

## 2022-06-30 DIAGNOSIS — Z96652 Presence of left artificial knee joint: Secondary | ICD-10-CM | POA: Diagnosis not present

## 2022-06-30 DIAGNOSIS — E785 Hyperlipidemia, unspecified: Secondary | ICD-10-CM | POA: Diagnosis not present

## 2022-06-30 DIAGNOSIS — R52 Pain, unspecified: Secondary | ICD-10-CM | POA: Diagnosis not present

## 2022-07-03 DIAGNOSIS — M6259 Muscle wasting and atrophy, not elsewhere classified, multiple sites: Secondary | ICD-10-CM | POA: Diagnosis not present

## 2022-07-03 DIAGNOSIS — E785 Hyperlipidemia, unspecified: Secondary | ICD-10-CM | POA: Diagnosis not present

## 2022-07-03 DIAGNOSIS — F0390 Unspecified dementia without behavioral disturbance: Secondary | ICD-10-CM | POA: Diagnosis not present

## 2022-07-03 DIAGNOSIS — R41841 Cognitive communication deficit: Secondary | ICD-10-CM | POA: Diagnosis not present

## 2022-07-03 DIAGNOSIS — Z96652 Presence of left artificial knee joint: Secondary | ICD-10-CM | POA: Diagnosis not present

## 2022-07-03 DIAGNOSIS — R52 Pain, unspecified: Secondary | ICD-10-CM | POA: Diagnosis not present

## 2022-07-04 DIAGNOSIS — R52 Pain, unspecified: Secondary | ICD-10-CM | POA: Diagnosis not present

## 2022-07-04 DIAGNOSIS — R41841 Cognitive communication deficit: Secondary | ICD-10-CM | POA: Diagnosis not present

## 2022-07-04 DIAGNOSIS — E785 Hyperlipidemia, unspecified: Secondary | ICD-10-CM | POA: Diagnosis not present

## 2022-07-04 DIAGNOSIS — M6259 Muscle wasting and atrophy, not elsewhere classified, multiple sites: Secondary | ICD-10-CM | POA: Diagnosis not present

## 2022-07-04 DIAGNOSIS — Z96652 Presence of left artificial knee joint: Secondary | ICD-10-CM | POA: Diagnosis not present

## 2022-07-04 DIAGNOSIS — F0390 Unspecified dementia without behavioral disturbance: Secondary | ICD-10-CM | POA: Diagnosis not present

## 2022-07-05 DIAGNOSIS — F0390 Unspecified dementia without behavioral disturbance: Secondary | ICD-10-CM | POA: Diagnosis not present

## 2022-07-05 DIAGNOSIS — R41841 Cognitive communication deficit: Secondary | ICD-10-CM | POA: Diagnosis not present

## 2022-07-05 DIAGNOSIS — M6259 Muscle wasting and atrophy, not elsewhere classified, multiple sites: Secondary | ICD-10-CM | POA: Diagnosis not present

## 2022-07-05 DIAGNOSIS — G309 Alzheimer's disease, unspecified: Secondary | ICD-10-CM | POA: Diagnosis not present

## 2022-07-05 DIAGNOSIS — Z96652 Presence of left artificial knee joint: Secondary | ICD-10-CM | POA: Diagnosis not present

## 2022-07-05 DIAGNOSIS — R52 Pain, unspecified: Secondary | ICD-10-CM | POA: Diagnosis not present

## 2022-07-05 DIAGNOSIS — I2510 Atherosclerotic heart disease of native coronary artery without angina pectoris: Secondary | ICD-10-CM | POA: Diagnosis not present

## 2022-07-05 DIAGNOSIS — R7303 Prediabetes: Secondary | ICD-10-CM | POA: Diagnosis not present

## 2022-07-05 DIAGNOSIS — I10 Essential (primary) hypertension: Secondary | ICD-10-CM | POA: Diagnosis not present

## 2022-07-05 DIAGNOSIS — E785 Hyperlipidemia, unspecified: Secondary | ICD-10-CM | POA: Diagnosis not present

## 2022-07-06 DIAGNOSIS — E785 Hyperlipidemia, unspecified: Secondary | ICD-10-CM | POA: Diagnosis not present

## 2022-07-06 DIAGNOSIS — F0390 Unspecified dementia without behavioral disturbance: Secondary | ICD-10-CM | POA: Diagnosis not present

## 2022-07-06 DIAGNOSIS — R52 Pain, unspecified: Secondary | ICD-10-CM | POA: Diagnosis not present

## 2022-07-06 DIAGNOSIS — Z96652 Presence of left artificial knee joint: Secondary | ICD-10-CM | POA: Diagnosis not present

## 2022-07-06 DIAGNOSIS — E876 Hypokalemia: Secondary | ICD-10-CM | POA: Diagnosis not present

## 2022-07-06 DIAGNOSIS — M6259 Muscle wasting and atrophy, not elsewhere classified, multiple sites: Secondary | ICD-10-CM | POA: Diagnosis not present

## 2022-07-06 DIAGNOSIS — R41841 Cognitive communication deficit: Secondary | ICD-10-CM | POA: Diagnosis not present

## 2022-07-07 DIAGNOSIS — M2041 Other hammer toe(s) (acquired), right foot: Secondary | ICD-10-CM | POA: Diagnosis not present

## 2022-07-07 DIAGNOSIS — B351 Tinea unguium: Secondary | ICD-10-CM | POA: Diagnosis not present

## 2022-07-07 DIAGNOSIS — Z96652 Presence of left artificial knee joint: Secondary | ICD-10-CM | POA: Diagnosis not present

## 2022-07-07 DIAGNOSIS — I739 Peripheral vascular disease, unspecified: Secondary | ICD-10-CM | POA: Diagnosis not present

## 2022-07-07 DIAGNOSIS — L603 Nail dystrophy: Secondary | ICD-10-CM | POA: Diagnosis not present

## 2022-07-07 DIAGNOSIS — R52 Pain, unspecified: Secondary | ICD-10-CM | POA: Diagnosis not present

## 2022-07-07 DIAGNOSIS — M6259 Muscle wasting and atrophy, not elsewhere classified, multiple sites: Secondary | ICD-10-CM | POA: Diagnosis not present

## 2022-07-07 DIAGNOSIS — F0390 Unspecified dementia without behavioral disturbance: Secondary | ICD-10-CM | POA: Diagnosis not present

## 2022-07-07 DIAGNOSIS — E785 Hyperlipidemia, unspecified: Secondary | ICD-10-CM | POA: Diagnosis not present

## 2022-07-07 DIAGNOSIS — R41841 Cognitive communication deficit: Secondary | ICD-10-CM | POA: Diagnosis not present

## 2022-07-07 DIAGNOSIS — M2042 Other hammer toe(s) (acquired), left foot: Secondary | ICD-10-CM | POA: Diagnosis not present

## 2022-07-08 DIAGNOSIS — R52 Pain, unspecified: Secondary | ICD-10-CM | POA: Diagnosis not present

## 2022-07-08 DIAGNOSIS — R41841 Cognitive communication deficit: Secondary | ICD-10-CM | POA: Diagnosis not present

## 2022-07-08 DIAGNOSIS — F0390 Unspecified dementia without behavioral disturbance: Secondary | ICD-10-CM | POA: Diagnosis not present

## 2022-07-08 DIAGNOSIS — Z96652 Presence of left artificial knee joint: Secondary | ICD-10-CM | POA: Diagnosis not present

## 2022-07-08 DIAGNOSIS — M6259 Muscle wasting and atrophy, not elsewhere classified, multiple sites: Secondary | ICD-10-CM | POA: Diagnosis not present

## 2022-07-08 DIAGNOSIS — E785 Hyperlipidemia, unspecified: Secondary | ICD-10-CM | POA: Diagnosis not present

## 2022-07-10 DIAGNOSIS — R52 Pain, unspecified: Secondary | ICD-10-CM | POA: Diagnosis not present

## 2022-07-10 DIAGNOSIS — R41841 Cognitive communication deficit: Secondary | ICD-10-CM | POA: Diagnosis not present

## 2022-07-10 DIAGNOSIS — F0390 Unspecified dementia without behavioral disturbance: Secondary | ICD-10-CM | POA: Diagnosis not present

## 2022-07-10 DIAGNOSIS — E785 Hyperlipidemia, unspecified: Secondary | ICD-10-CM | POA: Diagnosis not present

## 2022-07-10 DIAGNOSIS — M6259 Muscle wasting and atrophy, not elsewhere classified, multiple sites: Secondary | ICD-10-CM | POA: Diagnosis not present

## 2022-07-10 DIAGNOSIS — Z96652 Presence of left artificial knee joint: Secondary | ICD-10-CM | POA: Diagnosis not present

## 2022-07-11 DIAGNOSIS — M6259 Muscle wasting and atrophy, not elsewhere classified, multiple sites: Secondary | ICD-10-CM | POA: Diagnosis not present

## 2022-07-11 DIAGNOSIS — R52 Pain, unspecified: Secondary | ICD-10-CM | POA: Diagnosis not present

## 2022-07-11 DIAGNOSIS — Z96652 Presence of left artificial knee joint: Secondary | ICD-10-CM | POA: Diagnosis not present

## 2022-07-11 DIAGNOSIS — F0390 Unspecified dementia without behavioral disturbance: Secondary | ICD-10-CM | POA: Diagnosis not present

## 2022-07-11 DIAGNOSIS — R41841 Cognitive communication deficit: Secondary | ICD-10-CM | POA: Diagnosis not present

## 2022-07-11 DIAGNOSIS — E785 Hyperlipidemia, unspecified: Secondary | ICD-10-CM | POA: Diagnosis not present

## 2022-07-12 DIAGNOSIS — E785 Hyperlipidemia, unspecified: Secondary | ICD-10-CM | POA: Diagnosis not present

## 2022-07-12 DIAGNOSIS — E876 Hypokalemia: Secondary | ICD-10-CM | POA: Diagnosis not present

## 2022-07-13 DIAGNOSIS — F0390 Unspecified dementia without behavioral disturbance: Secondary | ICD-10-CM | POA: Diagnosis not present

## 2022-07-13 DIAGNOSIS — E785 Hyperlipidemia, unspecified: Secondary | ICD-10-CM | POA: Diagnosis not present

## 2022-07-13 DIAGNOSIS — M6259 Muscle wasting and atrophy, not elsewhere classified, multiple sites: Secondary | ICD-10-CM | POA: Diagnosis not present

## 2022-07-13 DIAGNOSIS — R41841 Cognitive communication deficit: Secondary | ICD-10-CM | POA: Diagnosis not present

## 2022-07-13 DIAGNOSIS — R52 Pain, unspecified: Secondary | ICD-10-CM | POA: Diagnosis not present

## 2022-07-13 DIAGNOSIS — Z96652 Presence of left artificial knee joint: Secondary | ICD-10-CM | POA: Diagnosis not present

## 2022-07-20 DIAGNOSIS — G300 Alzheimer's disease with early onset: Secondary | ICD-10-CM | POA: Diagnosis not present

## 2022-07-20 DIAGNOSIS — F02B0 Dementia in other diseases classified elsewhere, moderate, without behavioral disturbance, psychotic disturbance, mood disturbance, and anxiety: Secondary | ICD-10-CM | POA: Diagnosis not present

## 2023-10-31 IMAGING — DX DG KNEE 1-2V PORT*L*
2 series · 2 of 2 positions shown · non-contrast
Comparison: None.

CLINICAL DATA: Total left knee replacement.

EXAM:
PORTABLE LEFT KNEE - 1-2 VIEW

[knee (1 of 2)]
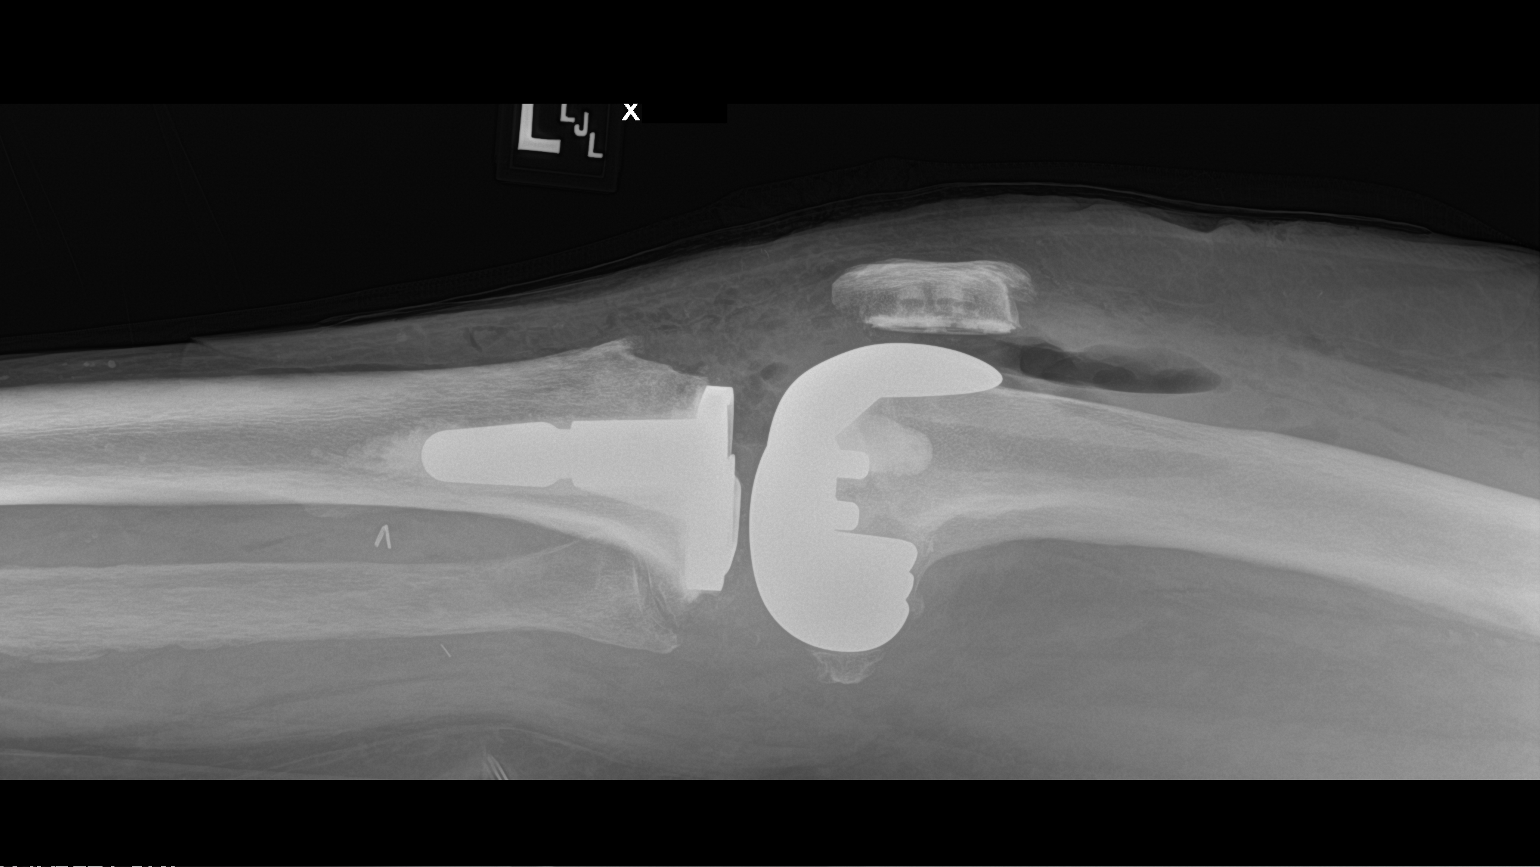

[knee (2 of 2)]
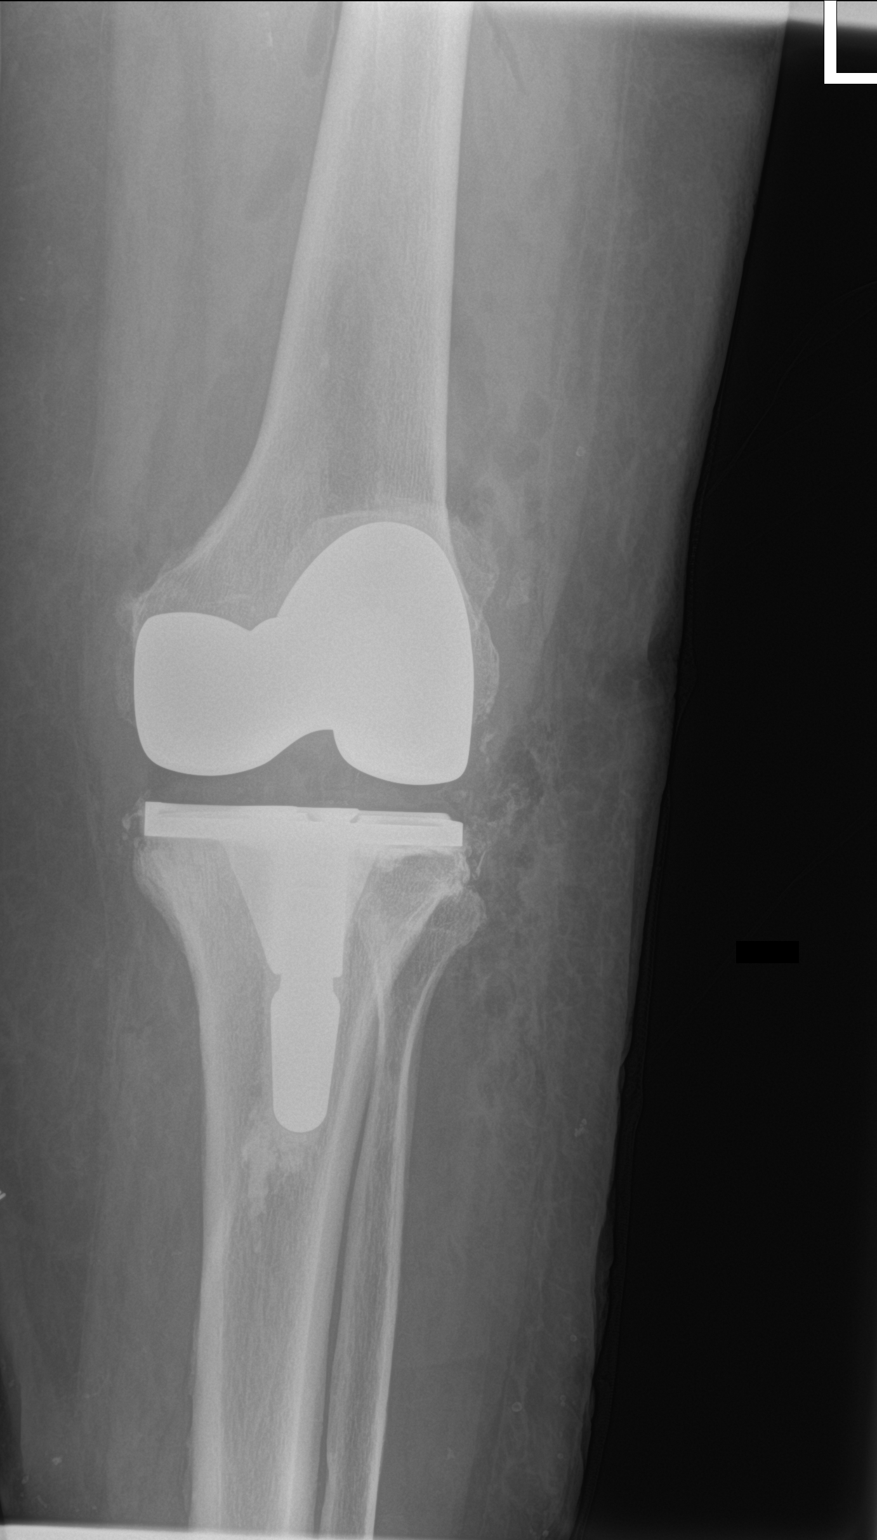

[2 of 2 positions shown; findings below may reference images not displayed]

FINDINGS: Total left knee replacement is identified without malalignment.
Postoperative changes including joint fluid and air are identified.
IMPRESSION: Total left knee replacement without malalignment.
